# Patient Record
Sex: Female | Born: 1950 | Race: White | Hispanic: No | Marital: Married | Smoking: Never smoker
Health system: Southern US, Community
[De-identification: ages and names within clinical notes are randomized; demographics above are authoritative.]

## PROBLEM LIST (undated history)

## (undated) DIAGNOSIS — E039 Hypothyroidism, unspecified: Secondary | ICD-10-CM

## (undated) DIAGNOSIS — R1310 Dysphagia, unspecified: Secondary | ICD-10-CM

## (undated) DIAGNOSIS — R490 Dysphonia: Secondary | ICD-10-CM

## (undated) DIAGNOSIS — M1990 Unspecified osteoarthritis, unspecified site: Secondary | ICD-10-CM

## (undated) DIAGNOSIS — Z8719 Personal history of other diseases of the digestive system: Secondary | ICD-10-CM

## (undated) DIAGNOSIS — Z87442 Personal history of urinary calculi: Secondary | ICD-10-CM

## (undated) DIAGNOSIS — G709 Myoneural disorder, unspecified: Secondary | ICD-10-CM

## (undated) DIAGNOSIS — I499 Cardiac arrhythmia, unspecified: Secondary | ICD-10-CM

## (undated) DIAGNOSIS — B351 Tinea unguium: Secondary | ICD-10-CM

## (undated) DIAGNOSIS — M797 Fibromyalgia: Secondary | ICD-10-CM

## (undated) DIAGNOSIS — R51 Headache: Secondary | ICD-10-CM

## (undated) DIAGNOSIS — K219 Gastro-esophageal reflux disease without esophagitis: Secondary | ICD-10-CM

## (undated) DIAGNOSIS — M479 Spondylosis, unspecified: Secondary | ICD-10-CM

## (undated) DIAGNOSIS — I10 Essential (primary) hypertension: Secondary | ICD-10-CM

## (undated) DIAGNOSIS — R002 Palpitations: Secondary | ICD-10-CM

## (undated) HISTORY — PX: DILATION AND CURETTAGE OF UTERUS: SHX78

## (undated) HISTORY — PX: CHOLECYSTECTOMY: SHX55

## (undated) HISTORY — DX: Myoneural disorder, unspecified: G70.9

## (undated) HISTORY — DX: Tinea unguium: B35.1

## (undated) HISTORY — DX: Dysphagia, unspecified: R13.10

## (undated) HISTORY — PX: WISDOM TOOTH EXTRACTION: SHX21

## (undated) HISTORY — DX: Palpitations: R00.2

---

## 1977-06-28 HISTORY — PX: OTHER SURGICAL HISTORY: SHX169

## 1988-06-28 HISTORY — PX: ABDOMINAL HYSTERECTOMY: SHX81

## 1993-06-28 HISTORY — PX: BRAIN SURGERY: SHX531

## 2006-06-08 HISTORY — PX: COLONOSCOPY: SHX174

## 2008-08-23 ENCOUNTER — Ambulatory Visit (HOSPITAL_COMMUNITY): Admission: RE | Admit: 2008-08-23 | Discharge: 2008-08-23 | Payer: Self-pay | Admitting: Family Medicine

## 2009-06-28 HISTORY — PX: PARATHYROID EXPLORATION: SHX732

## 2010-01-29 ENCOUNTER — Encounter (HOSPITAL_COMMUNITY): Admission: RE | Admit: 2010-01-29 | Discharge: 2010-03-19 | Payer: Self-pay | Admitting: Surgery

## 2010-01-30 ENCOUNTER — Ambulatory Visit (HOSPITAL_COMMUNITY): Admission: RE | Admit: 2010-01-30 | Discharge: 2010-01-30 | Payer: Self-pay | Admitting: Family Medicine

## 2010-02-02 ENCOUNTER — Ambulatory Visit (HOSPITAL_COMMUNITY): Admission: RE | Admit: 2010-02-02 | Discharge: 2010-02-02 | Payer: Self-pay | Admitting: Family Medicine

## 2010-03-12 ENCOUNTER — Ambulatory Visit (HOSPITAL_COMMUNITY): Admission: RE | Admit: 2010-03-12 | Discharge: 2010-03-12 | Payer: Self-pay | Admitting: Surgery

## 2010-03-27 ENCOUNTER — Ambulatory Visit (HOSPITAL_COMMUNITY)
Admission: RE | Admit: 2010-03-27 | Discharge: 2010-03-27 | Payer: Self-pay | Admitting: Physical Medicine and Rehabilitation

## 2010-05-11 ENCOUNTER — Encounter (INDEPENDENT_AMBULATORY_CARE_PROVIDER_SITE_OTHER): Payer: Self-pay | Admitting: Surgery

## 2010-05-11 ENCOUNTER — Observation Stay (HOSPITAL_COMMUNITY): Admission: RE | Admit: 2010-05-11 | Discharge: 2010-05-12 | Payer: Self-pay | Admitting: Surgery

## 2010-09-08 LAB — URINALYSIS, ROUTINE W REFLEX MICROSCOPIC
Bilirubin Urine: NEGATIVE
Glucose, UA: NEGATIVE mg/dL
Hgb urine dipstick: NEGATIVE
Specific Gravity, Urine: 1.021 (ref 1.005–1.030)
Urobilinogen, UA: 0.2 mg/dL (ref 0.0–1.0)
pH: 6.5 (ref 5.0–8.0)

## 2010-09-08 LAB — DIFFERENTIAL
Basophils Relative: 1 % (ref 0–1)
Eosinophils Absolute: 0.3 10*3/uL (ref 0.0–0.7)
Lymphs Abs: 4.7 10*3/uL — ABNORMAL HIGH (ref 0.7–4.0)
Monocytes Absolute: 0.6 10*3/uL (ref 0.1–1.0)
Monocytes Relative: 6 % (ref 3–12)

## 2010-09-08 LAB — BASIC METABOLIC PANEL
CO2: 29 mEq/L (ref 19–32)
Chloride: 104 mEq/L (ref 96–112)
Glucose, Bld: 95 mg/dL (ref 70–99)
Potassium: 3.9 mEq/L (ref 3.5–5.1)
Sodium: 140 mEq/L (ref 135–145)

## 2010-09-08 LAB — CBC
HCT: 42.5 % (ref 36.0–46.0)
Hemoglobin: 14.5 g/dL (ref 12.0–15.0)
MCH: 28.5 pg (ref 26.0–34.0)
MCHC: 34.1 g/dL (ref 30.0–36.0)
MCV: 83.7 fL (ref 78.0–100.0)

## 2010-09-08 LAB — SURGICAL PCR SCREEN: Staphylococcus aureus: NEGATIVE

## 2010-09-10 LAB — CREATININE, SERUM
Creatinine, Ser: 0.93 mg/dL (ref 0.4–1.2)
GFR calc Af Amer: >60 mL/min (ref >60)

## 2010-09-10 LAB — BUN: BUN: 22 mg/dL (ref 6–23)

## 2011-09-07 ENCOUNTER — Other Ambulatory Visit (HOSPITAL_COMMUNITY): Payer: Self-pay | Admitting: Internal Medicine

## 2011-09-07 ENCOUNTER — Ambulatory Visit (HOSPITAL_COMMUNITY)
Admission: RE | Admit: 2011-09-07 | Discharge: 2011-09-07 | Disposition: A | Discharge status: Home / Self Care | Payer: Medicare HMO | Source: Ambulatory Visit | Attending: Internal Medicine | Admitting: Internal Medicine

## 2011-09-07 DIAGNOSIS — Z139 Encounter for screening, unspecified: Secondary | ICD-10-CM

## 2011-09-07 DIAGNOSIS — E039 Hypothyroidism, unspecified: Secondary | ICD-10-CM

## 2011-09-07 DIAGNOSIS — M869 Osteomyelitis, unspecified: Secondary | ICD-10-CM

## 2011-09-07 DIAGNOSIS — Z9889 Other specified postprocedural states: Secondary | ICD-10-CM | POA: Insufficient documentation

## 2011-09-07 DIAGNOSIS — Z01419 Encounter for gynecological examination (general) (routine) without abnormal findings: Secondary | ICD-10-CM

## 2011-09-14 ENCOUNTER — Ambulatory Visit (HOSPITAL_COMMUNITY)
Admission: RE | Admit: 2011-09-14 | Discharge: 2011-09-14 | Disposition: A | Discharge status: Home / Self Care | Payer: Medicare HMO | Source: Ambulatory Visit | Attending: Internal Medicine | Admitting: Internal Medicine

## 2011-09-14 DIAGNOSIS — E079 Disorder of thyroid, unspecified: Secondary | ICD-10-CM | POA: Insufficient documentation

## 2011-09-14 DIAGNOSIS — Z01419 Encounter for gynecological examination (general) (routine) without abnormal findings: Secondary | ICD-10-CM

## 2011-09-14 DIAGNOSIS — Z139 Encounter for screening, unspecified: Secondary | ICD-10-CM

## 2011-09-14 DIAGNOSIS — E039 Hypothyroidism, unspecified: Secondary | ICD-10-CM

## 2011-09-14 DIAGNOSIS — Z1231 Encounter for screening mammogram for malignant neoplasm of breast: Secondary | ICD-10-CM | POA: Insufficient documentation

## 2011-09-17 ENCOUNTER — Other Ambulatory Visit (HOSPITAL_COMMUNITY): Payer: Self-pay | Admitting: Internal Medicine

## 2011-09-17 DIAGNOSIS — M869 Osteomyelitis, unspecified: Secondary | ICD-10-CM

## 2011-09-23 ENCOUNTER — Encounter (HOSPITAL_COMMUNITY)
Admission: RE | Admit: 2011-09-23 | Discharge: 2011-09-23 | Disposition: A | Discharge status: Home / Self Care | Payer: Medicare HMO | Source: Ambulatory Visit | Attending: Internal Medicine | Admitting: Internal Medicine

## 2011-09-23 ENCOUNTER — Encounter (HOSPITAL_COMMUNITY): Payer: Self-pay

## 2011-09-23 DIAGNOSIS — M869 Osteomyelitis, unspecified: Secondary | ICD-10-CM | POA: Insufficient documentation

## 2011-09-23 DIAGNOSIS — R51 Headache: Secondary | ICD-10-CM | POA: Insufficient documentation

## 2011-09-23 HISTORY — DX: Essential (primary) hypertension: I10

## 2011-09-23 MED ORDER — TECHNETIUM TC 99M MEDRONATE IV KIT
25.0000 | PACK | Freq: Once | INTRAVENOUS | Status: AC | PRN
  Administered 2011-09-23: 25 via INTRAVENOUS

## 2011-12-20 ENCOUNTER — Other Ambulatory Visit (HOSPITAL_COMMUNITY): Payer: Self-pay | Admitting: Family Medicine

## 2011-12-20 DIAGNOSIS — E039 Hypothyroidism, unspecified: Secondary | ICD-10-CM

## 2012-01-04 ENCOUNTER — Ambulatory Visit (HOSPITAL_COMMUNITY): Payer: Medicare HMO

## 2012-01-11 ENCOUNTER — Ambulatory Visit (HOSPITAL_COMMUNITY)
Admission: RE | Admit: 2012-01-11 | Discharge: 2012-01-11 | Disposition: A | Discharge status: Home / Self Care | Payer: Medicare HMO | Source: Ambulatory Visit | Attending: Family Medicine | Admitting: Family Medicine

## 2012-01-11 DIAGNOSIS — E049 Nontoxic goiter, unspecified: Secondary | ICD-10-CM | POA: Insufficient documentation

## 2012-01-11 DIAGNOSIS — E039 Hypothyroidism, unspecified: Secondary | ICD-10-CM | POA: Insufficient documentation

## 2013-04-03 ENCOUNTER — Other Ambulatory Visit (HOSPITAL_COMMUNITY): Payer: Self-pay | Admitting: Physician Assistant

## 2013-04-03 DIAGNOSIS — Z0000 Encounter for general adult medical examination without abnormal findings: Secondary | ICD-10-CM

## 2013-04-09 ENCOUNTER — Ambulatory Visit (HOSPITAL_COMMUNITY): Payer: Medicare HMO

## 2013-04-10 ENCOUNTER — Inpatient Hospital Stay (HOSPITAL_COMMUNITY): Admission: RE | Admit: 2013-04-10 | Payer: Medicare HMO | Source: Ambulatory Visit

## 2013-04-30 ENCOUNTER — Other Ambulatory Visit: Payer: Self-pay | Admitting: Gastroenterology

## 2013-04-30 DIAGNOSIS — K219 Gastro-esophageal reflux disease without esophagitis: Secondary | ICD-10-CM

## 2013-04-30 DIAGNOSIS — R1310 Dysphagia, unspecified: Secondary | ICD-10-CM

## 2013-05-01 ENCOUNTER — Encounter (HOSPITAL_COMMUNITY): Payer: Self-pay | Admitting: *Deleted

## 2013-05-02 ENCOUNTER — Ambulatory Visit
Admission: RE | Admit: 2013-05-02 | Discharge: 2013-05-02 | Disposition: A | Discharge status: Home / Self Care | Payer: Medicare Other | Source: Ambulatory Visit | Attending: Gastroenterology | Admitting: Gastroenterology

## 2013-05-02 DIAGNOSIS — K219 Gastro-esophageal reflux disease without esophagitis: Secondary | ICD-10-CM

## 2013-05-02 DIAGNOSIS — R1310 Dysphagia, unspecified: Secondary | ICD-10-CM

## 2013-05-04 ENCOUNTER — Encounter (HOSPITAL_COMMUNITY): Payer: Self-pay | Admitting: Pharmacy Technician

## 2013-05-08 ENCOUNTER — Other Ambulatory Visit: Payer: Self-pay | Admitting: Gastroenterology

## 2013-05-09 ENCOUNTER — Encounter (HOSPITAL_COMMUNITY): Payer: Self-pay | Admitting: *Deleted

## 2013-05-09 ENCOUNTER — Encounter (HOSPITAL_COMMUNITY)
Admission: RE | Disposition: A | Discharge status: Home / Self Care | Payer: Self-pay | Source: Ambulatory Visit | Attending: Gastroenterology

## 2013-05-09 ENCOUNTER — Ambulatory Visit (HOSPITAL_COMMUNITY)
Admission: RE | Admit: 2013-05-09 | Discharge: 2013-05-09 | Disposition: A | Discharge status: Home / Self Care | Payer: Medicare Other | Source: Ambulatory Visit | Attending: Gastroenterology | Admitting: Gastroenterology

## 2013-05-09 ENCOUNTER — Encounter (HOSPITAL_COMMUNITY): Payer: Medicare Other | Admitting: Anesthesiology

## 2013-05-09 ENCOUNTER — Ambulatory Visit (HOSPITAL_COMMUNITY): Payer: Medicare Other | Admitting: Anesthesiology

## 2013-05-09 DIAGNOSIS — K219 Gastro-esophageal reflux disease without esophagitis: Secondary | ICD-10-CM | POA: Insufficient documentation

## 2013-05-09 DIAGNOSIS — K0381 Cracked tooth: Secondary | ICD-10-CM | POA: Insufficient documentation

## 2013-05-09 DIAGNOSIS — K449 Diaphragmatic hernia without obstruction or gangrene: Secondary | ICD-10-CM | POA: Insufficient documentation

## 2013-05-09 DIAGNOSIS — I10 Essential (primary) hypertension: Secondary | ICD-10-CM | POA: Insufficient documentation

## 2013-05-09 DIAGNOSIS — R1310 Dysphagia, unspecified: Secondary | ICD-10-CM | POA: Insufficient documentation

## 2013-05-09 DIAGNOSIS — R490 Dysphonia: Secondary | ICD-10-CM | POA: Insufficient documentation

## 2013-05-09 DIAGNOSIS — K2960 Other gastritis without bleeding: Secondary | ICD-10-CM | POA: Insufficient documentation

## 2013-05-09 DIAGNOSIS — E039 Hypothyroidism, unspecified: Secondary | ICD-10-CM | POA: Insufficient documentation

## 2013-05-09 HISTORY — DX: Gastro-esophageal reflux disease without esophagitis: K21.9

## 2013-05-09 HISTORY — PX: ESOPHAGOGASTRODUODENOSCOPY (EGD) WITH PROPOFOL: SHX5813

## 2013-05-09 HISTORY — DX: Hypothyroidism, unspecified: E03.9

## 2013-05-09 HISTORY — PX: BRAVO PH STUDY: SHX5421

## 2013-05-09 HISTORY — DX: Unspecified osteoarthritis, unspecified site: M19.90

## 2013-05-09 HISTORY — DX: Personal history of urinary calculi: Z87.442

## 2013-05-09 HISTORY — DX: Headache: R51

## 2013-05-09 SURGERY — ESOPHAGOGASTRODUODENOSCOPY (EGD) WITH PROPOFOL
Anesthesia: Monitor Anesthesia Care

## 2013-05-09 MED ORDER — KETAMINE HCL 50 MG/ML IJ SOLN
INTRAMUSCULAR | Status: DC | PRN
  Administered 2013-05-09: 25 mg via INTRAMUSCULAR

## 2013-05-09 MED ORDER — LACTATED RINGERS IV SOLN
INTRAVENOUS | Status: DC | PRN
  Administered 2013-05-09: 09:00:00 via INTRAVENOUS

## 2013-05-09 MED ORDER — BUTAMBEN-TETRACAINE-BENZOCAINE 2-2-14 % EX AERO
INHALATION_SPRAY | CUTANEOUS | Status: DC | PRN
  Administered 2013-05-09: 2 via TOPICAL

## 2013-05-09 MED ORDER — PROPOFOL INFUSION 10 MG/ML OPTIME
INTRAVENOUS | Status: DC | PRN
  Administered 2013-05-09: 100 ug/kg/min via INTRAVENOUS

## 2013-05-09 SURGICAL SUPPLY — 14 items

## 2013-05-09 NOTE — Transfer of Care (Signed)
Immediate Anesthesia Transfer of Care Note  Patient: Kim Warren  Procedure(s) Performed: Procedure(s): ESOPHAGOGASTRODUODENOSCOPY (EGD) WITH PROPOFOL (N/A) BRAVO PH STUDY (N/A)  Patient Location: PACU  Anesthesia Type:MAC  Level of Consciousness: awake, alert  and oriented  Airway & Oxygen Therapy: Patient Spontanous Breathing and Patient connected to nasal cannula oxygen  Post-op Assessment: Report given to PACU RN, Post -op Vital signs reviewed and stable and Patient moving all extremities X 4  Post vital signs: Reviewed and stable  Complications: No apparent anesthesia complications

## 2013-05-09 NOTE — Op Note (Signed)
[facility] [address] [place], [address]   ENDOSCOPY PROCEDURE REPORT  PATIENT: Kim Warren, Kim Warren  MR#: [id number] BIRTHDATE: Mar 19, 1951 , 62  yrs. old GENDER: Female ENDOSCOPIST: Willis Modena, MD REFERRED BY:  Assunta Found, M.D. PROCEDURE DATE:  05/09/2013 PROCEDURE:  EGD w/ Bravo capsule placement (ON PPI) ASA CLASS:     Class II INDICATIONS:  Hoarseness, dysphagia, GERD, refractory to medical therapy. MEDICATIONS: MAC sedation, administered by CRNA TOPICAL ANESTHETIC: Cetacaine Spray  DESCRIPTION OF PROCEDURE: After the risks benefits and alternatives of the procedure were thoroughly explained, informed consent was obtained.  The Pentax Gastroscope [id number] endoscope was introduced through the mouth and advanced to the second portion of the duodenum. Without limitations.  The instrument was slowly withdrawn as the mucosa was fully examined.     Findings:  Small hiatal hernia, otherwise normal esophagus.  Mild gastritis with a few antral erosions, otherwise normal stomach and pylorus.  Normal duodenum to the second portion.    Upon completion of our diagnostic exam, a Bravo capsule was placed 6cm proximal to the GE junction employing standard protocol.  Appropriate positioning of the capsule was confirmed endoscopically to complete the procedure.          The scope was then withdrawn from the patient and the procedure completed.  ENDOSCOPIC IMPRESSION:     As above.  Successful Bravo capsule placement.  RECOMMENDATIONS:     1.  Watch for potential complications of procedure. 2.  Continue Nexium 40 mg bid. 3.  Await Bravo results. 4.  Follow-up with [facility] in 3-4 weeks.  eSigned:  Willis Modena, MD 05/09/2013 10:51 AM   CC:

## 2013-05-09 NOTE — H&P (Signed)
Patient interval history reviewed.  Patient examined again.  There has been no change from documented H/P dated 04/25/13 (scanned into chart from our office) except as documented above.  Assessment:  1.  Hoarseness, GERD symptoms, refractory to PPI therapy.  Plan:  1.  EGD with Bravo capsule placement. 2.  Risks (bleeding, infection, bowel perforation that could require surgery, sedation-related changes in cardiopulmonary systems), benefits (identification and possible treatment of source of symptoms, exclusion of certain causes of symptoms), and alternatives (watchful waiting, radiographic imaging studies, empiric medical treatment) of upper endoscopy with Bravo capsule placement (EGD with Bravo) were explained to patient/family in detail and patient wishes to proceed.

## 2013-05-09 NOTE — Anesthesia Preprocedure Evaluation (Addendum)
Anesthesia Evaluation  Patient identified by MRN, date of birth, ID band Patient awake    Reviewed: Allergy & Precautions, H&P , NPO status , Patient's Chart, lab work & pertinent test results  Airway Mallampati: II TM Distance: >3 FB Neck ROM: Full    Dental no notable dental hx. (+) Poor Dentition   Pulmonary neg pulmonary ROS,  breath sounds clear to auscultation  Pulmonary exam normal       Cardiovascular hypertension, Pt. on medications Rhythm:Regular Rate:Normal     Neuro/Psych negative neurological ROS  negative psych ROS   GI/Hepatic negative GI ROS, Neg liver ROS,   Endo/Other  Hypothyroidism   Renal/GU negative Renal ROS  negative genitourinary   Musculoskeletal negative musculoskeletal ROS (+)   Abdominal   Peds negative pediatric ROS (+)  Hematology negative hematology ROS (+)   Anesthesia Other Findings Upper front cap and back broken tooth  Reproductive/Obstetrics negative OB ROS                           Anesthesia Physical Anesthesia Plan  ASA: II  Anesthesia Plan: MAC   Post-op Pain Management:    Induction:   Airway Management Planned:   Additional Equipment:   Intra-op Plan:   Post-operative Plan:   Informed Consent: I have reviewed the patients History and Physical, chart, labs and discussed the procedure including the risks, benefits and alternatives for the proposed anesthesia with the patient or authorized representative who has indicated his/her understanding and acceptance.   Dental advisory given  Plan Discussed with: CRNA  Anesthesia Plan Comments:         Anesthesia Quick Evaluation

## 2013-05-09 NOTE — Addendum Note (Signed)
Addended by: Willis Modena on: 05/09/2013 07:45 AM   Modules accepted: Orders

## 2013-05-09 NOTE — Anesthesia Postprocedure Evaluation (Signed)
  Anesthesia Post-op Note  Patient: Kim Warren  Procedure(s) Performed: Procedure(s) (LRB): ESOPHAGOGASTRODUODENOSCOPY (EGD) WITH PROPOFOL (N/A) BRAVO PH STUDY (N/A)  Patient Location: PACU  Anesthesia Type: MAC  Level of Consciousness: awake and alert   Airway and Oxygen Therapy: Patient Spontanous Breathing  Post-op Pain: mild  Post-op Assessment: Post-op Vital signs reviewed, Patient's Cardiovascular Status Stable, Respiratory Function Stable, Patent Airway and No signs of Nausea or vomiting  Last Vitals:  Filed Vitals:   05/09/13 1120  BP: 127/72  Pulse:   Temp:   Resp: 14    Post-op Vital Signs: stable   Complications: No apparent anesthesia complications

## 2013-05-09 NOTE — Preoperative (Signed)
Beta Blockers   Reason not to administer Beta Blockers:Not Applicable 

## 2013-05-10 ENCOUNTER — Encounter (HOSPITAL_COMMUNITY): Payer: Self-pay | Admitting: Gastroenterology

## 2013-06-14 ENCOUNTER — Encounter (INDEPENDENT_AMBULATORY_CARE_PROVIDER_SITE_OTHER): Payer: Self-pay | Admitting: General Surgery

## 2013-06-27 ENCOUNTER — Encounter (INDEPENDENT_AMBULATORY_CARE_PROVIDER_SITE_OTHER): Payer: Self-pay | Admitting: Surgery

## 2013-06-27 ENCOUNTER — Ambulatory Visit
Admission: RE | Admit: 2013-06-27 | Discharge: 2013-06-27 | Disposition: A | Discharge status: Home / Self Care | Payer: Medicare Other | Source: Ambulatory Visit | Attending: Surgery | Admitting: Surgery

## 2013-06-27 ENCOUNTER — Ambulatory Visit (INDEPENDENT_AMBULATORY_CARE_PROVIDER_SITE_OTHER): Payer: Medicare Other | Admitting: Surgery

## 2013-06-27 VITALS — BP 126/82 | HR 78 | Temp 97.6°F | Resp 16 | Ht 60.0 in | Wt 170.2 lb

## 2013-06-27 DIAGNOSIS — K219 Gastro-esophageal reflux disease without esophagitis: Secondary | ICD-10-CM

## 2013-06-27 DIAGNOSIS — Z862 Personal history of diseases of the blood and blood-forming organs and certain disorders involving the immune mechanism: Secondary | ICD-10-CM

## 2013-06-27 DIAGNOSIS — Z8639 Personal history of other endocrine, nutritional and metabolic disease: Secondary | ICD-10-CM

## 2013-06-27 NOTE — Progress Notes (Signed)
Chief Complaint:  GERD-severe for 4 years  History of Present Illness:  Kim Warren is an 62 y.o. female comes today with her daughter to discuss her GERD that has been severe affecting her voice and vocal cords. This is been worse over the last 4 years and interestingly over that period of time she has lost about 4 inches in height. 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  Her calciums have returned to normal. She has had a workup by Dr. Chrissie Noa outlaw and I did review an esophagram which does not really show a significant hiatal hernia. She's b studies showing that she has reflux.  She also showed me a palpable mass involving the head of her right clavicle. We'll get an x-ray on that.  I discussed Nissen fundoplication with her and her daughter in some detail including its risks and limitations. I think that her esophageal dysmotility is  related to her osteoporosis and loss of height.    Past Medical History  Diagnosis Date  . Hypertension   . Hypothyroidism   . GERD (gastroesophageal reflux disease)   . History of kidney stones   . Arthritis   . Headache(784.0)     sinus headaches-seasonal  . Onychomycosis   . Neuromuscular disorder     fibromyalgia  . Dysphagia   . Palpitations     Past Surgical History  Procedure Laterality Date  . Cholecystectomy    . Periosteal chondroma  1979    left middle finger-benign  . Abdominal hysterectomy  1990    partial  . Dilation and curettage of uterus    . Brain surgery  1995    meningioma-benign  . Parathyroid exploration  2011    tumor off-benign  . Esophagogastroduodenoscopy (egd) with propofol N/A 05/09/2013    Procedure: ESOPHAGOGASTRODUODENOSCOPY (EGD) WITH PROPOFOL;  Surgeon: Willis Modena, MD;  Location: [facility];  Service: Endoscopy;  Laterality: N/A;  . Bravo ph study N/A 05/09/2013    Procedure: BRAVO PH STUDY;  Surgeon: Willis Modena, MD;  Location: [facility];  Service: Endoscopy;  Laterality:  N/A;  . Colonoscopy  06/08/06  . Wisdom tooth extraction      Current Outpatient Prescriptions  Medication Sig Dispense Refill  . aspirin EC 81 MG tablet Take 81 mg by mouth daily.      . Biotin 5000 MCG TABS Take 1 tablet by mouth daily.      Marland Kitchen bismuth subsalicylate (PEPTO BISMOL) 262 MG/15ML suspension Take 30 mLs by mouth every 6 (six) hours as needed for indigestion.      . Coenzyme Q10 (COQ10) 100 MG CAPS Take by mouth.      . cyclobenzaprine (FLEXERIL) 10 MG tablet Take 10 mg by mouth 3 (three) times daily.      . diphenoxylate-atropine (LOMOTIL) 2.5-0.025 MG per tablet Take 1 tablet by mouth 4 (four) times daily as needed for diarrhea or loose stools.      Marland Kitchen esomeprazole (NEXIUM) 40 MG capsule Take 40 mg by mouth 2 (two) times daily.      . fenofibrate 160 MG tablet Take 160 mg by mouth at bedtime.      . hydrochlorothiazide (HYDRODIURIL) 25 MG tablet Take 25 mg by mouth every morning.      Marland Kitchen HYDROcodone-acetaminophen (NORCO) 10-325 MG per tablet Take 1 tablet by mouth 3 (three) times daily.      Marland Kitchen lisinopril (PRINIVIL,ZESTRIL) 10 MG tablet Take 10 mg by mouth every morning.      Marland Kitchen  loratadine (CLARITIN) 10 MG tablet Take 10 mg by mouth every morning.      . meloxicam (MOBIC) 7.5 MG tablet Take 7.5 mg by mouth every morning.      . methylPREDNIsolone (MEDROL DOSPACK) 4 MG tablet       . nebivolol (BYSTOLIC) 5 MG tablet Take 5 mg by mouth every morning.      . pseudoephedrine (SUDAFED) 30 MG tablet Take 30 mg by mouth every 4 (four) hours as needed for congestion.      Marland Kitchen zolpidem (AMBIEN) 10 MG tablet Take 10 mg by mouth at bedtime as needed for sleep.      Marland Kitchen levothyroxine (SYNTHROID, LEVOTHROID) 100 MCG tablet Take 100 mcg by mouth daily before breakfast.      . Omega-3 Fatty Acids (FISH OIL) 1200 MG CAPS Take 1 capsule by mouth daily.       No current facility-administered medications for this visit.   Erythromycin and Morphine and related Family History  Problem Relation Age of  Onset  . Cancer Mother     colon cancer?  . Heart disease Father    Social History:   reports that she has never smoked. She does not have any smokeless tobacco history on file. She reports that she does not drink alcohol or use illicit drugs.   REVIEW OF SYSTEMS - PERTINENT POSITIVES ONLY: Positive see old chart.  Physical Exam:   Blood pressure 126/82, pulse 78, temperature 97.6 F (36.4 C), temperature source Temporal, resp. rate 16, height 5' (1.524 m), weight 170 lb 3.2 oz (77.202 kg). Body mass index is 33.24 kg/(m^2).  Gen:  WDWN white female NAD  Neurological: Alert and oriented to person, place, and time. Motor and sensory function is grossly intact  Head: Normocephalic and atraumatic.  Eyes: Conjunctivae are normal. Pupils are equal, round, and reactive to light. No scleral icterus.  Neck: Normal range of motion. Neck supple. No tracheal deviation or thyromegaly present. Prior parathyroid surgery scar is healed.  The right clavicular head is enlarged slighly.   LABORATORY RESULTS: No results found for this or any previous visit (from the past 48 hour(s)).  RADIOLOGY RESULTS: No results found.  Problem List: There are no active problems to display for this patient.   Assessment & Plan: GERD and esophageal dysmotility.  I think she is probably a good candidate for a laparoscopic Nissen fundoplication. We will also assess his clavicle with an x-ray to make sure she did have some bony cyst possibly a brown cyst related to her prior parathyroid adenoma. We'll see her back in 3 weeks at which time she can discuss whether she was to move forward with scheduling surgery.    Matt B. Daphine Deutscher, MD, [facility] [phone] beeper [phone]  06/27/2013 12:28 PM

## 2013-06-27 NOTE — Patient Instructions (Signed)
Nissen Fundoplication Care After Please read the instructions outlined below and refer to this sheet for the next few weeks. These discharge instructions provide you with general information on caring for yourself after you leave the hospital. Your doctor may also give you specific instructions. While your treatment has been planned according to the most current medical practices available, unavoidable complications sometimes happen. If you have any problems or questions after discharge, please call your doctor. ACTIVITY  Take frequent rest periods throughout the day.  Take frequent walks throughout the day. This will help to prevent blood clots.  Continue to do your coughing and deep breathing exercises once you get home. This will help to prevent pneumonia.  No strenuous activities such as heavy lifting, pushing or pulling until after your follow-up visit with your doctor. Do not lift anything heavier than 10 pounds.  Talk with your caregiver about when you may return to work and your exercise routine.  You may shower 2 days after surgery. Pat incisions dry. Do not rub incisions with washcloth or towel.  Do not drive while taking prescription pain medication. NUTRITION  Continue with a liquid diet, or the diet you were directed to take, until your first follow-up visit with your surgeon.  Drink fluids (6-8 glasses a day).  Call your caregiver for persistent nausea (feeling sick to your stomach), vomiting, bloating or difficulty swallowing. ELIMINATION It is very important not to strain during bowel movements. If constipation should occur, you may:  Take a mild laxative (such as Milk of Magnesia).  Add fruit and bran to your diet.  Drink more fluids.  Call your caregiver if constipation is not relieved. FEVER If you feel feverish or have shaking chills, take your temperature. If it is 102 F (38.9 C) or above, call your caregiver. The fever may mean there is an infection. PAIN  CONTROL  If a prescription was given for a pain reliever, please follow your caregiver's directions.  Only take over-the-counter or prescription medicines for pain, discomfort, or fever as directed by your caregiver.  If the pain is not relieved by your medicine, becomes worse, or you have difficulty breathing, call your doctor. INCISION  It is normal for your cuts (incisions) from surgery to have a small amount of drainage for the first 1-2 days. Once the drainage has stopped, leave your incision(s) open to air.  Check your incision(s) and surrounding area daily for any redness, swelling, increased drainage or bleeding. If any of these are present or if the wound edges start to separate, call your doctor.  If you have small adhesive strips in place, they will peel and fall off. (If these strips are covered with a clear bandage, your doctor will tell you when to remove them.)  If you have staples, your caregiver will remove them at the follow-up appointment. Document Released: 02/05/2004 Document Revised: 09/06/2011 Document Reviewed: 05/11/2007 Exit[employer] Patient Information 2014 [employer].  

## 2013-07-16 ENCOUNTER — Other Ambulatory Visit (HOSPITAL_COMMUNITY): Payer: Self-pay | Admitting: Internal Medicine

## 2013-07-16 DIAGNOSIS — R222 Localized swelling, mass and lump, trunk: Secondary | ICD-10-CM

## 2013-07-18 ENCOUNTER — Ambulatory Visit (HOSPITAL_COMMUNITY)
Admission: RE | Admit: 2013-07-18 | Discharge: 2013-07-18 | Disposition: A | Discharge status: Home / Self Care | Payer: Medicare HMO | Source: Ambulatory Visit | Attending: Internal Medicine | Admitting: Internal Medicine

## 2013-07-18 ENCOUNTER — Encounter (INDEPENDENT_AMBULATORY_CARE_PROVIDER_SITE_OTHER): Payer: Medicare Other | Admitting: Surgery

## 2013-07-18 DIAGNOSIS — R222 Localized swelling, mass and lump, trunk: Secondary | ICD-10-CM

## 2013-07-18 DIAGNOSIS — M47814 Spondylosis without myelopathy or radiculopathy, thoracic region: Secondary | ICD-10-CM | POA: Insufficient documentation

## 2013-07-18 DIAGNOSIS — R229 Localized swelling, mass and lump, unspecified: Secondary | ICD-10-CM | POA: Insufficient documentation

## 2013-08-31 ENCOUNTER — Other Ambulatory Visit (INDEPENDENT_AMBULATORY_CARE_PROVIDER_SITE_OTHER): Payer: Self-pay | Admitting: Surgery

## 2013-08-31 ENCOUNTER — Ambulatory Visit (INDEPENDENT_AMBULATORY_CARE_PROVIDER_SITE_OTHER): Payer: Medicare HMO | Admitting: Surgery

## 2013-08-31 ENCOUNTER — Encounter (INDEPENDENT_AMBULATORY_CARE_PROVIDER_SITE_OTHER): Payer: Self-pay | Admitting: Surgery

## 2013-08-31 VITALS — BP 134/86 | HR 78 | Temp 98.8°F | Resp 16 | Ht 60.0 in | Wt 172.2 lb

## 2013-08-31 DIAGNOSIS — K219 Gastro-esophageal reflux disease without esophagitis: Secondary | ICD-10-CM

## 2013-08-31 NOTE — Progress Notes (Signed)
Chief Complaint:  GERD, osteoporosis, hoarseness, severe reflux  History of Present Illness:  Kim Warren is an 63 y.o. female seen by me in December with a very significant symptoms of GERD including nocturnal reflux hoarseness and ear pain. We have discussed lap Nissen before and she decided she wants to pursue this. I described the procedure to her in some detail and she elected go ahead and get this scheduled.  Past Medical History  Diagnosis Date  . Hypertension   . Hypothyroidism   . GERD (gastroesophageal reflux disease)   . History of kidney stones   . Arthritis   . Headache(784.0)     sinus headaches-seasonal  . Onychomycosis   . Neuromuscular disorder     fibromyalgia  . Dysphagia   . Palpitations     Past Surgical History  Procedure Laterality Date  . Cholecystectomy    . Periosteal chondroma  1979    left middle finger-benign  . Abdominal hysterectomy  1990    partial  . Dilation and curettage of uterus    . Brain surgery  1995    meningioma-benign  . Parathyroid exploration  2011    tumor off-benign  . Esophagogastroduodenoscopy (egd) with propofol N/A 05/09/2013    Procedure: ESOPHAGOGASTRODUODENOSCOPY (EGD) WITH PROPOFOL;  Surgeon: Arta Silence, MD;  Location: [facility];  Service: Endoscopy;  Laterality: N/A;  . Bravo ph study N/A 05/09/2013    Procedure: BRAVO [place];  Surgeon: Arta Silence, MD;  Location: [facility];  Service: Endoscopy;  Laterality: N/A;  . Colonoscopy  06/08/06  . Wisdom tooth extraction      Current Outpatient Prescriptions  Medication Sig Dispense Refill  . aspirin EC 81 MG tablet Take 81 mg by mouth daily.      . Biotin 5000 MCG TABS Take 1 tablet by mouth daily.      Marland Kitchen bismuth subsalicylate (PEPTO BISMOL) 262 MG/15ML suspension Take 30 mLs by mouth every 6 (six) hours as needed for indigestion.      . Coenzyme Q10 (COQ10) 100 MG CAPS Take by mouth.      . cyclobenzaprine (FLEXERIL) 10 MG tablet Take 10 mg by mouth 3  (three) times daily.      . diphenoxylate-atropine (LOMOTIL) 2.5-0.025 MG per tablet Take 1 tablet by mouth 4 (four) times daily as needed for diarrhea or loose stools.      Marland Kitchen esomeprazole (NEXIUM) 40 MG capsule Take 40 mg by mouth 2 (two) times daily.      . fenofibrate 160 MG tablet Take 160 mg by mouth at bedtime.      . hydrochlorothiazide (HYDRODIURIL) 25 MG tablet Take 25 mg by mouth every morning.      Marland Kitchen HYDROcodone-acetaminophen (NORCO) 10-325 MG per tablet Take 1 tablet by mouth 3 (three) times daily.      Marland Kitchen levothyroxine (SYNTHROID, LEVOTHROID) 100 MCG tablet Take 100 mcg by mouth daily before breakfast.      . lisinopril (PRINIVIL,ZESTRIL) 10 MG tablet Take 10 mg by mouth every morning.      . loratadine (CLARITIN) 10 MG tablet Take 10 mg by mouth every morning.      . meloxicam (MOBIC) 7.5 MG tablet Take 7.5 mg by mouth every morning.      . methylPREDNIsolone (MEDROL DOSPACK) 4 MG tablet       . metoprolol succinate (TOPROL-XL) 50 MG 24 hr tablet Take 50 mg by mouth daily. Take with or immediately following a meal.      .  Omega-3 Fatty Acids (FISH OIL) 1200 MG CAPS Take 1 capsule by mouth daily.      . pseudoephedrine (SUDAFED) 30 MG tablet Take 30 mg by mouth every 4 (four) hours as needed for congestion.      Marland Kitchen zolpidem (AMBIEN) 10 MG tablet Take 10 mg by mouth at bedtime as needed for sleep.       No current facility-administered medications for this visit.   Erythromycin and Morphine and related Family History  Problem Relation Age of Onset  . Cancer Mother     colon cancer?  . Heart disease Father    Social History:   reports that she has never smoked. She does not have any smokeless tobacco history on file. She reports that she does not drink alcohol or use illicit drugs.   REVIEW OF SYSTEMS - PERTINENT POSITIVES ONLY: No history of DVT  Physical Exam:   Blood pressure 134/86, pulse 78, temperature 98.8 F (37.1 C), temperature source Oral, resp. rate 16, height 5'  (1.524 m), weight 172 lb 3.2 oz (78.109 kg). Body mass index is 33.63 kg/(m^2).  Gen:  WDWN white female NAD  Neurological: Alert and oriented to person, place, and time. Motor and sensory function is grossly intact  Head: Normocephalic and atraumatic.  Eyes: Conjunctivae are normal. Pupils are equal, round, and reactive to light. No scleral icterus.  Neck: Normal range of motion. Neck supple. No tracheal deviation or thyromegaly present.  Cardiovascular:  SR without murmurs or gallops.  No carotid bruits Respiratory: Effort normal.  No respiratory distress. No chest wall tenderness. Breath sounds normal.  No wheezes, rales or rhonchi.  Abdomen:  nontender GU: Musculoskeletal: Normal range of motion. Extremities are nontender. No cyanosis, edema or clubbing noted Lymphadenopathy: No cervical, preauricular, postauricular or axillary adenopathy is present Skin: Skin is warm and dry. No rash noted. No diaphoresis. No erythema. No pallor. Pscyh: Normal mood and affect. Behavior is normal. Judgment and thought content normal.   LABORATORY RESULTS: No results found for this or any previous visit (from the past 48 hour(s)).  RADIOLOGY RESULTS: No results found.  Problem List: Patient Active Problem List   Diagnosis Date Noted  . GERD (gastroesophageal reflux disease) 06/27/2013    Assessment & Plan: Symptomatic gastroesophageal reflux disease severe plan laparoscopic Nissen fundoplication and hiatal hernia repair.    Matt B. Hassell Done, MD, [facility] [phone] beeper [phone]  08/31/2013 3:45 PM

## 2013-08-31 NOTE — Patient Instructions (Signed)
Nissen Fundoplication Care After Please read the instructions outlined below and refer to this sheet for the next few weeks. These discharge instructions provide you with general information on caring for yourself after you leave the hospital. Your doctor may also give you specific instructions. While your treatment has been planned according to the most current medical practices available, unavoidable complications sometimes happen. If you have any problems or questions after discharge, please call your doctor. ACTIVITY  Take frequent rest periods throughout the day.  Take frequent walks throughout the day. This will help to prevent blood clots.  Continue to do your coughing and deep breathing exercises once you get home. This will help to prevent pneumonia.  No strenuous activities such as heavy lifting, pushing or pulling until after your follow-up visit with your doctor. Do not lift anything heavier than 10 pounds.  Talk with your caregiver about when you may return to work and your exercise routine.  You may shower 2 days after surgery. Pat incisions dry. Do not rub incisions with washcloth or towel.  Do not drive while taking prescription pain medication. NUTRITION  Continue with a liquid diet, or the diet you were directed to take, until your first follow-up visit with your surgeon.  Drink fluids (6-8 glasses a day).  Call your caregiver for persistent nausea (feeling sick to your stomach), vomiting, bloating or difficulty swallowing. ELIMINATION It is very important not to strain during bowel movements. If constipation should occur, you may:  Take a mild laxative (such as Milk of Magnesia).  Add fruit and bran to your diet.  Drink more fluids.  Call your caregiver if constipation is not relieved. FEVER If you feel feverish or have shaking chills, take your temperature. If it is 102 F (38.9 C) or above, call your caregiver. The fever may mean there is an infection. PAIN  CONTROL  If a prescription was given for a pain reliever, please follow your caregiver's directions.  Only take over-the-counter or prescription medicines for pain, discomfort, or fever as directed by your caregiver.  If the pain is not relieved by your medicine, becomes worse, or you have difficulty breathing, call your doctor. INCISION  It is normal for your cuts (incisions) from surgery to have a small amount of drainage for the first 1-2 days. Once the drainage has stopped, leave your incision(s) open to air.  Check your incision(s) and surrounding area daily for any redness, swelling, increased drainage or bleeding. If any of these are present or if the wound edges start to separate, call your doctor.  If you have small adhesive strips in place, they will peel and fall off. (If these strips are covered with a clear bandage, your doctor will tell you when to remove them.)  If you have staples, your caregiver will remove them at the follow-up appointment. Document Released: 02/05/2004 Document Revised: 09/06/2011 Document Reviewed: 05/11/2007 Exit[employer] Patient Information 2014 [employer].  

## 2013-09-12 ENCOUNTER — Encounter (HOSPITAL_COMMUNITY): Payer: Self-pay | Admitting: Pharmacy Technician

## 2013-09-18 ENCOUNTER — Encounter (HOSPITAL_COMMUNITY)
Admission: RE | Admit: 2013-09-18 | Discharge: 2013-09-18 | Disposition: A | Discharge status: Home / Self Care | Payer: Medicare HMO | Source: Ambulatory Visit | Attending: Surgery | Admitting: Surgery

## 2013-09-18 ENCOUNTER — Encounter (HOSPITAL_COMMUNITY): Payer: Self-pay

## 2013-09-18 HISTORY — DX: Spondylosis, unspecified: M47.9

## 2013-09-18 HISTORY — DX: Fibromyalgia: M79.7

## 2013-09-18 HISTORY — DX: Personal history of other diseases of the digestive system: Z87.19

## 2013-09-18 HISTORY — DX: Cardiac arrhythmia, unspecified: I49.9

## 2013-09-18 HISTORY — DX: Dysphonia: R49.0

## 2013-09-18 LAB — BASIC METABOLIC PANEL
BUN: 21 mg/dL (ref 6–23)
CHLORIDE: 99 meq/L (ref 96–112)
CO2: 23 mEq/L (ref 19–32)
CREATININE: 0.95 mg/dL (ref 0.50–1.10)
Calcium: 10 mg/dL (ref 8.4–10.5)
GFR calc non Af Amer: 63 mL/min — ABNORMAL LOW (ref >90)
GFR, EST AFRICAN AMERICAN: 73 mL/min — AB (ref >90)
Glucose, Bld: 123 mg/dL — ABNORMAL HIGH (ref 70–99)
Potassium: 4.2 mEq/L (ref 3.7–5.3)
Sodium: 137 mEq/L (ref 137–147)

## 2013-09-18 LAB — CBC
HCT: 43.2 % (ref 36.0–46.0)
Hemoglobin: 14.5 g/dL (ref 12.0–15.0)
MCH: 26.8 pg (ref 26.0–34.0)
MCHC: 33.6 g/dL (ref 30.0–36.0)
MCV: 79.7 fL (ref 78.0–100.0)
Platelets: 326 10*3/uL (ref 150–400)
RBC: 5.42 MIL/uL — ABNORMAL HIGH (ref 3.87–5.11)
RDW: 14.5 % (ref 11.5–15.5)
WBC: 9.7 10*3/uL (ref 4.0–10.5)

## 2013-09-18 MED ORDER — CHLORHEXIDINE GLUCONATE 4 % EX LIQD: 1.0000 "application " | Freq: Once | CUTANEOUS | Status: DC

## 2013-09-18 NOTE — Pre-Procedure Instructions (Signed)
CT CHEST REPORT IN EPIC FROM 07-18-13. EKG WAS DONE TODAY PREOP - AT [facility].

## 2013-09-18 NOTE — Patient Instructions (Addendum)
   YOUR SURGERY IS SCHEDULED AT [facility]  ON:  Monday  3/30  REPORT TO  SHORT STAY CENTER AT:  5:30 AM      PHONE # FOR SHORT STAY IS [phone]  DO NOT EAT OR DRINK ANYTHING AFTER MIDNIGHT THE NIGHT BEFORE YOUR SURGERY.  YOU MAY BRUSH YOUR TEETH, RINSE OUT YOUR MOUTH--BUT NO WATER, NO FOOD, NO CHEWING GUM, NO MINTS, NO CANDIES, NO CHEWING TOBACCO.  PLEASE TAKE THE FOLLOWING MEDICATIONS THE AM OF YOUR SURGERY WITH A FEW SIPS OF WATER:  NEXIUM, HYDROCODONE / ACETAMINOPHEN, CLARITIN, LEVOTHYROXINE, METOPROLOL.   DO NOT BRING VALUABLES, MONEY, CREDIT CARDS.  DO NOT WEAR JEWELRY, MAKE-UP, NAIL POLISH AND NO METAL PINS OR CLIPS IN YOUR HAIR. CONTACT LENS, DENTURES / PARTIALS, GLASSES SHOULD NOT BE WORN TO SURGERY AND IN MOST CASES-HEARING AIDS WILL NEED TO BE REMOVED.  BRING YOUR GLASSES CASE, ANY EQUIPMENT NEEDED FOR YOUR CONTACT LENS. FOR PATIENTS ADMITTED TO THE HOSPITAL--CHECK OUT TIME THE DAY OF DISCHARGE IS 11:00 AM.  ALL INPATIENT ROOMS ARE PRIVATE - WITH BATHROOM, TELEPHONE, TELEVISION AND WIFI INTERNET.   FAILURE TO FOLLOW THESE INSTRUCTIONS MAY RESULT IN THE CANCELLATION OF YOUR SURGERY. PLEASE BE AWARE THAT YOU MAY NEED ADDITIONAL BLOOD DRAWN DAY OF YOUR SURGERY  PATIENT SIGNATURE_________________________________

## 2013-09-21 ENCOUNTER — Ambulatory Visit (HOSPITAL_COMMUNITY)
Admission: RE | Admit: 2013-09-21 | Discharge: 2013-09-21 | Disposition: A | Discharge status: Home / Self Care | Payer: Medicare HMO | Source: Ambulatory Visit | Attending: Family Medicine | Admitting: Family Medicine

## 2013-09-21 ENCOUNTER — Other Ambulatory Visit (HOSPITAL_COMMUNITY): Payer: Self-pay | Admitting: Family Medicine

## 2013-09-21 ENCOUNTER — Encounter (HOSPITAL_COMMUNITY): Payer: Self-pay

## 2013-09-21 DIAGNOSIS — M5030 Other cervical disc degeneration, unspecified cervical region: Secondary | ICD-10-CM | POA: Insufficient documentation

## 2013-09-21 DIAGNOSIS — M4802 Spinal stenosis, cervical region: Secondary | ICD-10-CM | POA: Insufficient documentation

## 2013-09-21 DIAGNOSIS — M47812 Spondylosis without myelopathy or radiculopathy, cervical region: Secondary | ICD-10-CM | POA: Insufficient documentation

## 2013-09-21 DIAGNOSIS — M542 Cervicalgia: Secondary | ICD-10-CM

## 2013-09-21 DIAGNOSIS — M79609 Pain in unspecified limb: Secondary | ICD-10-CM | POA: Insufficient documentation

## 2013-09-23 ENCOUNTER — Other Ambulatory Visit (INDEPENDENT_AMBULATORY_CARE_PROVIDER_SITE_OTHER): Payer: Self-pay | Admitting: Surgery

## 2013-09-23 NOTE — Anesthesia Preprocedure Evaluation (Signed)
Anesthesia Evaluation  Patient identified by MRN, date of birth, ID band Patient awake    Reviewed: Allergy & Precautions, H&P , NPO status , Patient's Chart, lab work & pertinent test results, reviewed documented beta blocker date and time   Airway Mallampati: II TM Distance: >3 FB Neck ROM: Full    Dental no notable dental hx. (+) Poor Dentition   Pulmonary neg pulmonary ROS,  breath sounds clear to auscultation  Pulmonary exam normal       Cardiovascular hypertension, Pt. on medications and Pt. on home beta blockers Rhythm:Regular Rate:Normal     Neuro/Psych  Headaches, negative psych ROS   GI/Hepatic Neg liver ROS, hiatal hernia, GERD-  Medicated,  Endo/Other  Hypothyroidism   Renal/GU negative Renal ROS     Musculoskeletal  (+) Fibromyalgia -, narcotic dependent  Abdominal (+) + obese,   Peds  Hematology negative hematology ROS (+)   Anesthesia Other Findings Upper front cap and back broken tooth  Reproductive/Obstetrics negative OB ROS                           Anesthesia Physical  Anesthesia Plan  ASA: II  Anesthesia Plan: General   Post-op Pain Management:    Induction: Intravenous and Rapid sequence  Airway Management Planned: Oral ETT  Additional Equipment:   Intra-op Plan:   Post-operative Plan: Extubation in OR  Informed Consent: I have reviewed the patients History and Physical, chart, labs and discussed the procedure including the risks, benefits and alternatives for the proposed anesthesia with the patient or authorized representative who has indicated his/her understanding and acceptance.   Dental advisory given  Plan Discussed with: CRNA  Anesthesia Plan Comments:         Anesthesia Quick Evaluation

## 2013-09-24 ENCOUNTER — Encounter (HOSPITAL_COMMUNITY): Payer: Self-pay | Admitting: *Deleted

## 2013-09-24 ENCOUNTER — Encounter (HOSPITAL_COMMUNITY): Payer: Medicare HMO | Admitting: Anesthesiology

## 2013-09-24 ENCOUNTER — Inpatient Hospital Stay (HOSPITAL_COMMUNITY): Payer: Medicare HMO | Admitting: Anesthesiology

## 2013-09-24 ENCOUNTER — Inpatient Hospital Stay (HOSPITAL_COMMUNITY)
Admission: RE | Admit: 2013-09-24 | Discharge: 2013-09-26 | DRG: 328 | Disposition: A | Discharge status: Home / Self Care | Payer: Medicare HMO | Source: Ambulatory Visit | Attending: Surgery | Admitting: Surgery

## 2013-09-24 ENCOUNTER — Encounter (HOSPITAL_COMMUNITY)
Admission: RE | Disposition: A | Discharge status: Home / Self Care | Payer: Self-pay | Source: Ambulatory Visit | Attending: Surgery

## 2013-09-24 DIAGNOSIS — E039 Hypothyroidism, unspecified: Secondary | ICD-10-CM | POA: Diagnosis present

## 2013-09-24 DIAGNOSIS — K219 Gastro-esophageal reflux disease without esophagitis: Principal | ICD-10-CM | POA: Diagnosis present

## 2013-09-24 DIAGNOSIS — Z9889 Other specified postprocedural states: Secondary | ICD-10-CM | POA: Diagnosis present

## 2013-09-24 DIAGNOSIS — Z8249 Family history of ischemic heart disease and other diseases of the circulatory system: Secondary | ICD-10-CM

## 2013-09-24 DIAGNOSIS — K660 Peritoneal adhesions (postprocedural) (postinfection): Secondary | ICD-10-CM | POA: Diagnosis present

## 2013-09-24 DIAGNOSIS — Z87442 Personal history of urinary calculi: Secondary | ICD-10-CM

## 2013-09-24 DIAGNOSIS — K449 Diaphragmatic hernia without obstruction or gangrene: Secondary | ICD-10-CM | POA: Diagnosis present

## 2013-09-24 DIAGNOSIS — K2100 Gastro-esophageal reflux disease with esophagitis, without bleeding: Secondary | ICD-10-CM

## 2013-09-24 DIAGNOSIS — M810 Age-related osteoporosis without current pathological fracture: Secondary | ICD-10-CM | POA: Diagnosis present

## 2013-09-24 DIAGNOSIS — Z7982 Long term (current) use of aspirin: Secondary | ICD-10-CM

## 2013-09-24 DIAGNOSIS — I10 Essential (primary) hypertension: Secondary | ICD-10-CM | POA: Diagnosis present

## 2013-09-24 DIAGNOSIS — Z79899 Other long term (current) drug therapy: Secondary | ICD-10-CM

## 2013-09-24 HISTORY — PX: LAPAROSCOPIC NISSEN FUNDOPLICATION: SHX1932

## 2013-09-24 LAB — CBC
HEMATOCRIT: 39.3 % (ref 36.0–46.0)
Hemoglobin: 13.2 g/dL (ref 12.0–15.0)
MCH: 26.9 pg (ref 26.0–34.0)
MCHC: 33.6 g/dL (ref 30.0–36.0)
MCV: 80 fL (ref 78.0–100.0)
PLATELETS: 268 10*3/uL (ref 150–400)
RBC: 4.91 MIL/uL (ref 3.87–5.11)
RDW: 14.5 % (ref 11.5–15.5)
WBC: 14.2 10*3/uL — ABNORMAL HIGH (ref 4.0–10.5)

## 2013-09-24 LAB — CREATININE, SERUM
Creatinine, Ser: 0.83 mg/dL (ref 0.50–1.10)
GFR calc non Af Amer: 75 mL/min — ABNORMAL LOW (ref >90)
GFR, EST AFRICAN AMERICAN: 86 mL/min — AB (ref >90)

## 2013-09-24 SURGERY — FUNDOPLICATION, NISSEN, LAPAROSCOPIC
Anesthesia: General | Site: Abdomen

## 2013-09-24 MED ORDER — LISINOPRIL 10 MG PO TABS
10.0000 mg | ORAL_TABLET | Freq: Every morning | ORAL | Status: DC
  Administered 2013-09-24 – 2013-09-26 (×3): 10 mg via ORAL
  Filled 2013-09-24 (×3): qty 1

## 2013-09-24 MED ORDER — NEOSTIGMINE METHYLSULFATE 1 MG/ML IJ SOLN
INTRAMUSCULAR | Status: DC | PRN
  Administered 2013-09-24: 5 mg via INTRAVENOUS

## 2013-09-24 MED ORDER — KCL IN DEXTROSE-NACL 20-5-0.45 MEQ/L-%-% IV SOLN
INTRAVENOUS | Status: AC
  Administered 2013-09-24 – 2013-09-25 (×3): via INTRAVENOUS
  Filled 2013-09-24 (×3): qty 1000

## 2013-09-24 MED ORDER — HYDROMORPHONE HCL PF 1 MG/ML IJ SOLN
INTRAMUSCULAR | Status: DC | PRN
  Administered 2013-09-24 (×2): 0.5 mg via INTRAVENOUS

## 2013-09-24 MED ORDER — HYDROMORPHONE HCL PF 2 MG/ML IJ SOLN
INTRAMUSCULAR | Status: AC
  Filled 2013-09-24: qty 1

## 2013-09-24 MED ORDER — ACETAMINOPHEN 10 MG/ML IV SOLN
1000.0000 mg | INTRAVENOUS | Status: AC
  Administered 2013-09-24: 1000 mg via INTRAVENOUS
  Filled 2013-09-24: qty 100

## 2013-09-24 MED ORDER — UNJURY CHICKEN SOUP POWDER: 2.0000 [oz_av] | Freq: Four times a day (QID) | ORAL | Status: DC

## 2013-09-24 MED ORDER — DEXAMETHASONE SODIUM PHOSPHATE 10 MG/ML IJ SOLN
INTRAMUSCULAR | Status: AC
  Filled 2013-09-24: qty 1

## 2013-09-24 MED ORDER — LACTATED RINGERS IV SOLN
INTRAVENOUS | Status: DC | PRN
  Administered 2013-09-24 (×2): via INTRAVENOUS

## 2013-09-24 MED ORDER — ONDANSETRON HCL 4 MG/2ML IJ SOLN
4.0000 mg | INTRAMUSCULAR | Status: DC | PRN
  Administered 2013-09-25: 4 mg via INTRAVENOUS
  Filled 2013-09-24: qty 2

## 2013-09-24 MED ORDER — EPHEDRINE SULFATE 50 MG/ML IJ SOLN
INTRAMUSCULAR | Status: DC | PRN
  Administered 2013-09-24: 10 mg via INTRAVENOUS
  Administered 2013-09-24: 5 mg via INTRAVENOUS

## 2013-09-24 MED ORDER — GLYCOPYRROLATE 0.2 MG/ML IJ SOLN
INTRAMUSCULAR | Status: AC
  Filled 2013-09-24: qty 3

## 2013-09-24 MED ORDER — GLYCOPYRROLATE 0.2 MG/ML IJ SOLN
INTRAMUSCULAR | Status: DC | PRN
  Administered 2013-09-24: 0.6 mg via INTRAVENOUS

## 2013-09-24 MED ORDER — MIDAZOLAM HCL 2 MG/2ML IJ SOLN
INTRAMUSCULAR | Status: AC
  Filled 2013-09-24: qty 2

## 2013-09-24 MED ORDER — CEFAZOLIN SODIUM-DEXTROSE 2-3 GM-% IV SOLR
INTRAVENOUS | Status: AC
  Filled 2013-09-24: qty 50

## 2013-09-24 MED ORDER — PROPOFOL 10 MG/ML IV BOLUS
INTRAVENOUS | Status: AC
  Filled 2013-09-24: qty 20

## 2013-09-24 MED ORDER — UNJURY CHOCOLATE CLASSIC POWDER: 2.0000 [oz_av] | Freq: Four times a day (QID) | ORAL | Status: DC

## 2013-09-24 MED ORDER — LIDOCAINE HCL (CARDIAC) 20 MG/ML IV SOLN
INTRAVENOUS | Status: AC
  Filled 2013-09-24: qty 5

## 2013-09-24 MED ORDER — LACTATED RINGERS IR SOLN
Status: DC | PRN
  Administered 2013-09-24: 1000 mL

## 2013-09-24 MED ORDER — PHENYLEPHRINE 40 MCG/ML (10ML) SYRINGE FOR IV PUSH (FOR BLOOD PRESSURE SUPPORT)
PREFILLED_SYRINGE | INTRAVENOUS | Status: AC
  Filled 2013-09-24: qty 10

## 2013-09-24 MED ORDER — KETAMINE HCL 10 MG/ML IJ SOLN
INTRAMUSCULAR | Status: DC | PRN
  Administered 2013-09-24: 30 mg via INTRAVENOUS

## 2013-09-24 MED ORDER — ROCURONIUM BROMIDE 100 MG/10ML IV SOLN
INTRAVENOUS | Status: DC | PRN
  Administered 2013-09-24: 10 mg via INTRAVENOUS
  Administered 2013-09-24: 50 mg via INTRAVENOUS
  Administered 2013-09-24: 10 mg via INTRAVENOUS

## 2013-09-24 MED ORDER — HYDROMORPHONE HCL PF 1 MG/ML IJ SOLN
0.5000 mg | INTRAMUSCULAR | Status: DC | PRN
  Administered 2013-09-24 – 2013-09-25 (×8): 0.5 mg via INTRAVENOUS
  Filled 2013-09-24 (×8): qty 1

## 2013-09-24 MED ORDER — METOCLOPRAMIDE HCL 5 MG/ML IJ SOLN
INTRAMUSCULAR | Status: AC
  Filled 2013-09-24: qty 2

## 2013-09-24 MED ORDER — SUCCINYLCHOLINE CHLORIDE 20 MG/ML IJ SOLN
INTRAMUSCULAR | Status: DC | PRN
  Administered 2013-09-24: 100 mg via INTRAVENOUS

## 2013-09-24 MED ORDER — NEOSTIGMINE METHYLSULFATE 1 MG/ML IJ SOLN
INTRAMUSCULAR | Status: AC
  Filled 2013-09-24: qty 10

## 2013-09-24 MED ORDER — ONDANSETRON HCL 4 MG/2ML IJ SOLN
INTRAMUSCULAR | Status: DC | PRN
  Administered 2013-09-24: 4 mg via INTRAVENOUS

## 2013-09-24 MED ORDER — ACETAMINOPHEN 160 MG/5ML PO SOLN
325.0000 mg | ORAL | Status: DC | PRN
Start: 2013-09-24 — End: 2013-09-26

## 2013-09-24 MED ORDER — ROCURONIUM BROMIDE 100 MG/10ML IV SOLN
INTRAVENOUS | Status: AC
  Filled 2013-09-24: qty 1

## 2013-09-24 MED ORDER — KETAMINE HCL 10 MG/ML IJ SOLN
INTRAMUSCULAR | Status: AC
  Filled 2013-09-24: qty 1

## 2013-09-24 MED ORDER — EPHEDRINE SULFATE 50 MG/ML IJ SOLN
INTRAMUSCULAR | Status: AC
  Filled 2013-09-24: qty 1

## 2013-09-24 MED ORDER — CEFAZOLIN SODIUM-DEXTROSE 2-3 GM-% IV SOLR
2.0000 g | INTRAVENOUS | Status: AC
  Administered 2013-09-24: 2 g via INTRAVENOUS

## 2013-09-24 MED ORDER — ACETAMINOPHEN 160 MG/5ML PO SOLN: 650.0000 mg | ORAL | Status: DC | PRN

## 2013-09-24 MED ORDER — FENTANYL CITRATE 0.05 MG/ML IJ SOLN
INTRAMUSCULAR | Status: DC | PRN
  Administered 2013-09-24: 100 ug via INTRAVENOUS
  Administered 2013-09-24 (×3): 50 ug via INTRAVENOUS

## 2013-09-24 MED ORDER — FENTANYL CITRATE 0.05 MG/ML IJ SOLN
INTRAMUSCULAR | Status: AC
  Filled 2013-09-24: qty 5

## 2013-09-24 MED ORDER — METOPROLOL SUCCINATE ER 50 MG PO TB24
50.0000 mg | ORAL_TABLET | Freq: Every morning | ORAL | Status: DC
  Administered 2013-09-25 – 2013-09-26 (×2): 50 mg via ORAL
  Filled 2013-09-24 (×2): qty 1

## 2013-09-24 MED ORDER — UNJURY VANILLA POWDER
2.0000 [oz_av] | Freq: Four times a day (QID) | ORAL | Status: DC
  Administered 2013-09-26 (×2): 2 [oz_av] via ORAL

## 2013-09-24 MED ORDER — PROMETHAZINE HCL 25 MG/ML IJ SOLN: 6.2500 mg | INTRAMUSCULAR | Status: DC | PRN

## 2013-09-24 MED ORDER — HEPARIN SODIUM (PORCINE) 5000 UNIT/ML IJ SOLN
5000.0000 [IU] | Freq: Once | INTRAMUSCULAR | Status: AC
  Administered 2013-09-24: 5000 [IU] via SUBCUTANEOUS
  Filled 2013-09-24: qty 1

## 2013-09-24 MED ORDER — PHENYLEPHRINE HCL 10 MG/ML IJ SOLN
INTRAMUSCULAR | Status: AC
  Filled 2013-09-24: qty 1

## 2013-09-24 MED ORDER — PHENYLEPHRINE HCL 10 MG/ML IJ SOLN
INTRAMUSCULAR | Status: DC | PRN
  Administered 2013-09-24 (×4): 80 ug via INTRAVENOUS

## 2013-09-24 MED ORDER — HYDROMORPHONE HCL PF 1 MG/ML IJ SOLN
INTRAMUSCULAR | Status: AC
  Filled 2013-09-24: qty 1

## 2013-09-24 MED ORDER — HYDROMORPHONE HCL PF 1 MG/ML IJ SOLN
0.2500 mg | INTRAMUSCULAR | Status: DC | PRN
  Administered 2013-09-24 (×4): 0.5 mg via INTRAVENOUS

## 2013-09-24 MED ORDER — DEXTROSE 5 % IV SOLN
10.0000 mg | INTRAVENOUS | Status: DC | PRN
  Administered 2013-09-24: 25 ug/min via INTRAVENOUS

## 2013-09-24 MED ORDER — DEXAMETHASONE SODIUM PHOSPHATE 10 MG/ML IJ SOLN
INTRAMUSCULAR | Status: DC | PRN
  Administered 2013-09-24: 10 mg via INTRAVENOUS

## 2013-09-24 MED ORDER — LIDOCAINE HCL (CARDIAC) 20 MG/ML IV SOLN
INTRAVENOUS | Status: DC | PRN
  Administered 2013-09-24: 100 mg via INTRAVENOUS

## 2013-09-24 MED ORDER — PROPOFOL 10 MG/ML IV BOLUS
INTRAVENOUS | Status: DC | PRN
  Administered 2013-09-24: 150 mg via INTRAVENOUS

## 2013-09-24 MED ORDER — METOCLOPRAMIDE HCL 5 MG/ML IJ SOLN
INTRAMUSCULAR | Status: DC | PRN
  Administered 2013-09-24: 10 mg via INTRAVENOUS

## 2013-09-24 MED ORDER — MIDAZOLAM HCL 5 MG/5ML IJ SOLN
INTRAMUSCULAR | Status: DC | PRN
  Administered 2013-09-24: 2 mg via INTRAVENOUS

## 2013-09-24 MED ORDER — MEPERIDINE HCL 50 MG/ML IJ SOLN: 6.2500 mg | INTRAMUSCULAR | Status: DC | PRN

## 2013-09-24 MED ORDER — BUPIVACAINE LIPOSOME 1.3 % IJ SUSP
20.0000 mL | Freq: Once | INTRAMUSCULAR | Status: AC
  Administered 2013-09-24: 20 mL
  Filled 2013-09-24: qty 20

## 2013-09-24 MED ORDER — ONDANSETRON HCL 4 MG/2ML IJ SOLN
INTRAMUSCULAR | Status: AC
  Filled 2013-09-24: qty 2

## 2013-09-24 MED ORDER — HEPARIN SODIUM (PORCINE) 5000 UNIT/ML IJ SOLN
5000.0000 [IU] | Freq: Three times a day (TID) | INTRAMUSCULAR | Status: DC
  Administered 2013-09-24 – 2013-09-26 (×6): 5000 [IU] via SUBCUTANEOUS
  Filled 2013-09-24 (×8): qty 1

## 2013-09-24 MED ORDER — OXYCODONE HCL 5 MG/5ML PO SOLN
5.0000 mg | ORAL | Status: DC | PRN
  Administered 2013-09-25: 10 mg via ORAL
  Administered 2013-09-25: 5 mg via ORAL
  Administered 2013-09-26 (×2): 10 mg via ORAL
  Filled 2013-09-24 (×4): qty 10

## 2013-09-24 MED ORDER — SODIUM CHLORIDE 0.9 % IJ SOLN
INTRAMUSCULAR | Status: AC
  Filled 2013-09-24: qty 10

## 2013-09-24 SURGICAL SUPPLY — 56 items
APPLIER CLIP ROT 10 11.4 M/L (STAPLE)
BENZOIN TINCTURE PRP APPL 2/3 (GAUZE/BANDAGES/DRESSINGS) IMPLANT
CABLE HIGH FREQUENCY MONO STRZ (ELECTRODE) IMPLANT
CANISTER SUCTION 2500CC (MISCELLANEOUS) IMPLANT
CLAMP ENDO BABCK 10MM (STAPLE) IMPLANT
CLIP APPLIE ROT 10 11.4 M/L (STAPLE) IMPLANT
CLOSURE WOUND 1/2 X4 (GAUZE/BANDAGES/DRESSINGS)
COVER SURGICAL LIGHT HANDLE (MISCELLANEOUS) IMPLANT
DECANTER SPIKE VIAL GLASS SM (MISCELLANEOUS) IMPLANT
DEVICE SUT QUICK LOAD TK 5 (STAPLE) ×8 IMPLANT
DEVICE SUT TI-KNOT TK 5X26 (MISCELLANEOUS) ×2 IMPLANT
DEVICE SUTURE ENDOST 10MM (ENDOMECHANICALS) ×3 IMPLANT
DEVICE TI KNOT TK5 (MISCELLANEOUS) ×1
DISSECTOR BLUNT TIP ENDO 5MM (MISCELLANEOUS) ×3 IMPLANT
DRAIN PENROSE 18X1/2 LTX STRL (DRAIN) ×3 IMPLANT
DRAPE LAPAROSCOPIC ABDOMINAL (DRAPES) ×3 IMPLANT
ELECT REM PT RETURN 9FT ADLT (ELECTROSURGICAL) ×3
ELECTRODE REM PT RTRN 9FT ADLT (ELECTROSURGICAL) ×1 IMPLANT
FILTER SMOKE EVAC LAPAROSHD (FILTER) IMPLANT
GLOVE BIOGEL M 8.0 STRL (GLOVE) ×3 IMPLANT
GLOVE BIOGEL PI IND STRL 7.0 (GLOVE) ×4 IMPLANT
GLOVE BIOGEL PI INDICATOR 7.0 (GLOVE) ×8
GOWN STRL REUS W/TWL LRG LVL3 (GOWN DISPOSABLE) IMPLANT
GOWN STRL REUS W/TWL XL LVL3 (GOWN DISPOSABLE) ×15 IMPLANT
GRASPER ENDO BABCOCK 10 (MISCELLANEOUS) IMPLANT
GRASPER ENDO BABCOCK 10MM (MISCELLANEOUS)
KIT BASIN OR (CUSTOM PROCEDURE TRAY) ×3 IMPLANT
NS IRRIG 1000ML POUR BTL (IV SOLUTION) ×3 IMPLANT
PENCIL BUTTON HOLSTER BLD 10FT (ELECTRODE) IMPLANT
QUICK LOAD TK 5 (STAPLE) ×4
SCALPEL HARMONIC ACE (MISCELLANEOUS) ×3 IMPLANT
SCISSORS LAP 5X35 DISP (ENDOMECHANICALS) ×3 IMPLANT
SET IRRIG TUBING LAPAROSCOPIC (IRRIGATION / IRRIGATOR) ×3 IMPLANT
SLEEVE ADV FIXATION 5X100MM (TROCAR) ×6 IMPLANT
SLEEVE Z-THREAD 5X100MM (TROCAR) IMPLANT
SOLUTION ANTI FOG 6CC (MISCELLANEOUS) ×6 IMPLANT
STAPLER VISISTAT 35W (STAPLE) ×3 IMPLANT
STRIP CLOSURE SKIN 1/2X4 (GAUZE/BANDAGES/DRESSINGS) IMPLANT
SUT DEVICE BRAIDED 0X39 (SUTURE) ×12 IMPLANT
SUT DEVICE BRAIDED 2.0X39 (SUTURE) ×3 IMPLANT
SUT SURGIDAC NAB ES-9 0 48 120 (SUTURE) ×9 IMPLANT
SUT VIC AB 4-0 SH 18 (SUTURE) ×3 IMPLANT
SYR 30ML LL (SYRINGE) ×3 IMPLANT
TIP INNERVISION DETACH 40FR (MISCELLANEOUS) IMPLANT
TIP INNERVISION DETACH 50FR (MISCELLANEOUS) IMPLANT
TIP INNERVISION DETACH 56FR (MISCELLANEOUS) ×3 IMPLANT
TIPS INNERVISION DETACH 40FR (MISCELLANEOUS)
TRAY FOLEY CATH 14FRSI W/METER (CATHETERS) ×3 IMPLANT
TRAY LAP CHOLE (CUSTOM PROCEDURE TRAY) ×3 IMPLANT
TROCAR ADV FIXATION 11X100MM (TROCAR) ×6 IMPLANT
TROCAR ADV FIXATION 5X100MM (TROCAR) ×6 IMPLANT
TROCAR BLADELESS OPT 5 100 (ENDOMECHANICALS) ×3 IMPLANT
TROCAR XCEL BLUNT TIP 100MML (ENDOMECHANICALS) IMPLANT
TROCAR XCEL NON-BLD 11X100MML (ENDOMECHANICALS) IMPLANT
TROCAR XCEL UNIV SLVE 11M 100M (ENDOMECHANICALS) IMPLANT
TUBING FILTER THERMOFLATOR (ELECTROSURGICAL) ×3 IMPLANT

## 2013-09-24 NOTE — Interval H&P Note (Signed)
History and Physical Interval Note:  09/24/2013 7:28 AM  Kim Warren  has presented today for surgery, with the diagnosis of GERD  The various methods of treatment have been discussed with the patient and family. After consideration of risks, benefits and other options for treatment, the patient has consented to  Procedure(s): [place] (N/A) as a surgical intervention .  The patient's history has been reviewed, patient examined, no change in status, stable for surgery.  I have reviewed the patient's chart and labs.  Questions were answered to the patient's satisfaction.     Kim Warren B

## 2013-09-24 NOTE — Transfer of Care (Signed)
Immediate Anesthesia Transfer of Care Note  Patient: Kim Warren  Procedure(s) Performed: Procedure(s) (LRB): LAPAROSCOPIC NISSEN AND HIATAL HERNIA REPAIR (N/A)  Patient Location: PACU  Anesthesia Type: General  Level of Consciousness: sedated, patient cooperative and responds to stimulation  Airway & Oxygen Therapy: Patient Spontanous Breathing and Patient connected to face mask oxgen  Post-op Assessment: Report given to PACU RN and Post -op Vital signs reviewed and stable  Post vital signs: Reviewed and stable  Complications: No apparent anesthesia complications

## 2013-09-24 NOTE — Op Note (Signed)
Surgeon: Kaylyn Lim, MD, FACS  Asst:  Armandina Gemma, MD, FACS  Anes:  general  Procedure: Laparoscopic enterolysis, Lap Nissen over 56 dilator with 1 suture closure of the hiatus  Diagnosis: GERD  Complications: non  EBL:   20 cc  Description of Procedure:  The patient was taken to OR 11 given general anesthesia. The abdomen was prepped with PCMX and draped sterilely. Access to the abdomen was achieved through the left upper quadrant using a 5 mm 0 Optiview technique without difficulty. The abdomen was insufflated in standard trocar placements were used. R case oh upgrade of the left periumbilical trocar to light 11 to accommodate the 10 mm scope. Also the one to the right of midline was a 12 to accommodate the Endo 360.  The operation began with taking down adhesions that appeared to be kinking the outflow of the stomach. I felt this could be causing an aggravating her reflux to using sharp dissection mainly in some harmonic dissection we took down adhesions from her old open cholecystectomy that were binding up her pyloric region. Once this was free we put a 5 mm Nathanson retractor in and retracted the liver.  Dissection of the foregut revealed a very stuck esophagus to the surrounding tissue we're able to free this satisfactorily to take down the adhesions to the upper stomach to enable me to do a tension-free wrap. This was done with harmonic scalpel. Bleeding was controlled. Following this we dissected the posterior right and left crura and found a small defect but not much. I went ahead and repair that with a single 0 surgidec with Endoclose 360.  Following this the lighted 56 bougie was passed without difficulty and around this a wrap of the distal esophagus was performed using a 0 Surgidek Pl. With the Endoloop 360 and secured with Ty knots. 3 such sutures were placed taking a purchase of periesophageal tissue with each. The 56 dilator was withdrawn. Good healthy-looking wrap was present.   Ports were closed 4-0 Vicryl after being injected with Exparel and closed with Dermabond.  Matt B. Hassell Done, [place], [facility], [place]

## 2013-09-24 NOTE — Preoperative (Signed)
Beta Blockers   Reason not to administer Beta Blockers:Not Applicable Patient took Beta Blocker 09-24-13 AM

## 2013-09-24 NOTE — Anesthesia Postprocedure Evaluation (Signed)
Anesthesia Post Note  Patient: Kim Warren  Procedure(s) Performed: Procedure(s) (LRB): LAPAROSCOPIC NISSEN AND HIATAL HERNIA REPAIR (N/A)  Anesthesia type: General  Patient location: PACU  Post pain: Pain level controlled  Post assessment: Post-op Vital signs reviewed  Last Vitals: BP 134/79  Pulse 89  Temp(Src) 36.7 C (Oral)  Resp 15  SpO2 97%  Post vital signs: Reviewed  Level of consciousness: sedated  Complications: No apparent anesthesia complications

## 2013-09-24 NOTE — H&P (View-Only) (Signed)
Chief Complaint:  GERD, osteoporosis, hoarseness, severe reflux  History of Present Illness:  Kim Warren is an 63 y.o. female seen by me in December with a very significant symptoms of GERD including nocturnal reflux hoarseness and ear pain. We have discussed lap Nissen before and she decided she wants to pursue this. I described the procedure to her in some detail and she elected go ahead and get this scheduled.  Past Medical History  Diagnosis Date  . Hypertension   . Hypothyroidism   . GERD (gastroesophageal reflux disease)   . History of kidney stones   . Arthritis   . Headache(784.0)     sinus headaches-seasonal  . Onychomycosis   . Neuromuscular disorder     fibromyalgia  . Dysphagia   . Palpitations     Past Surgical History  Procedure Laterality Date  . Cholecystectomy    . Periosteal chondroma  1979    left middle finger-benign  . Abdominal hysterectomy  1990    partial  . Dilation and curettage of uterus    . Brain surgery  1995    meningioma-benign  . Parathyroid exploration  2011    tumor off-benign  . Esophagogastroduodenoscopy (egd) with propofol N/A 05/09/2013    Procedure: ESOPHAGOGASTRODUODENOSCOPY (EGD) WITH PROPOFOL;  Surgeon: Arta Silence, MD;  Location: [facility];  Service: Endoscopy;  Laterality: N/A;  . Bravo ph study N/A 05/09/2013    Procedure: BRAVO [place];  Surgeon: Arta Silence, MD;  Location: [facility];  Service: Endoscopy;  Laterality: N/A;  . Colonoscopy  06/08/06  . Wisdom tooth extraction      Current Outpatient Prescriptions  Medication Sig Dispense Refill  . aspirin EC 81 MG tablet Take 81 mg by mouth daily.      . Biotin 5000 MCG TABS Take 1 tablet by mouth daily.      Marland Kitchen bismuth subsalicylate (PEPTO BISMOL) 262 MG/15ML suspension Take 30 mLs by mouth every 6 (six) hours as needed for indigestion.      . Coenzyme Q10 (COQ10) 100 MG CAPS Take by mouth.      . cyclobenzaprine (FLEXERIL) 10 MG tablet Take 10 mg by mouth 3  (three) times daily.      . diphenoxylate-atropine (LOMOTIL) 2.5-0.025 MG per tablet Take 1 tablet by mouth 4 (four) times daily as needed for diarrhea or loose stools.      Marland Kitchen esomeprazole (NEXIUM) 40 MG capsule Take 40 mg by mouth 2 (two) times daily.      . fenofibrate 160 MG tablet Take 160 mg by mouth at bedtime.      . hydrochlorothiazide (HYDRODIURIL) 25 MG tablet Take 25 mg by mouth every morning.      Marland Kitchen HYDROcodone-acetaminophen (NORCO) 10-325 MG per tablet Take 1 tablet by mouth 3 (three) times daily.      Marland Kitchen levothyroxine (SYNTHROID, LEVOTHROID) 100 MCG tablet Take 100 mcg by mouth daily before breakfast.      . lisinopril (PRINIVIL,ZESTRIL) 10 MG tablet Take 10 mg by mouth every morning.      . loratadine (CLARITIN) 10 MG tablet Take 10 mg by mouth every morning.      . meloxicam (MOBIC) 7.5 MG tablet Take 7.5 mg by mouth every morning.      . methylPREDNIsolone (MEDROL DOSPACK) 4 MG tablet       . metoprolol succinate (TOPROL-XL) 50 MG 24 hr tablet Take 50 mg by mouth daily. Take with or immediately following a meal.      .  Omega-3 Fatty Acids (FISH OIL) 1200 MG CAPS Take 1 capsule by mouth daily.      . pseudoephedrine (SUDAFED) 30 MG tablet Take 30 mg by mouth every 4 (four) hours as needed for congestion.      Marland Kitchen zolpidem (AMBIEN) 10 MG tablet Take 10 mg by mouth at bedtime as needed for sleep.       No current facility-administered medications for this visit.   Erythromycin and Morphine and related Family History  Problem Relation Age of Onset  . Cancer Mother     colon cancer?  . Heart disease Father    Social History:   reports that she has never smoked. She does not have any smokeless tobacco history on file. She reports that she does not drink alcohol or use illicit drugs.   REVIEW OF SYSTEMS - PERTINENT POSITIVES ONLY: No history of DVT  Physical Exam:   Blood pressure 134/86, pulse 78, temperature 98.8 F (37.1 C), temperature source Oral, resp. rate 16, height 5'  (1.524 m), weight 172 lb 3.2 oz (78.109 kg). Body mass index is 33.63 kg/(m^2).  Gen:  WDWN white female NAD  Neurological: Alert and oriented to person, place, and time. Motor and sensory function is grossly intact  Head: Normocephalic and atraumatic.  Eyes: Conjunctivae are normal. Pupils are equal, round, and reactive to light. No scleral icterus.  Neck: Normal range of motion. Neck supple. No tracheal deviation or thyromegaly present.  Cardiovascular:  SR without murmurs or gallops.  No carotid bruits Respiratory: Effort normal.  No respiratory distress. No chest wall tenderness. Breath sounds normal.  No wheezes, rales or rhonchi.  Abdomen:  nontender GU: Musculoskeletal: Normal range of motion. Extremities are nontender. No cyanosis, edema or clubbing noted Lymphadenopathy: No cervical, preauricular, postauricular or axillary adenopathy is present Skin: Skin is warm and dry. No rash noted. No diaphoresis. No erythema. No pallor. Pscyh: Normal mood and affect. Behavior is normal. Judgment and thought content normal.   LABORATORY RESULTS: No results found for this or any previous visit (from the past 48 hour(s)).  RADIOLOGY RESULTS: No results found.  Problem List: Patient Active Problem List   Diagnosis Date Noted  . GERD (gastroesophageal reflux disease) 06/27/2013    Assessment & Plan: Symptomatic gastroesophageal reflux disease severe plan laparoscopic Nissen fundoplication and hiatal hernia repair.    Matt B. Hassell Done, MD, [facility] [phone] beeper [phone]  08/31/2013 3:45 PM

## 2013-09-25 ENCOUNTER — Encounter (HOSPITAL_COMMUNITY): Payer: Self-pay | Admitting: Surgery

## 2013-09-25 ENCOUNTER — Inpatient Hospital Stay (HOSPITAL_COMMUNITY): Payer: Medicare HMO

## 2013-09-25 LAB — CBC WITH DIFFERENTIAL/PLATELET
BASOS ABS: 0 10*3/uL (ref 0.0–0.1)
BASOS PCT: 0 % (ref 0–1)
EOS PCT: 0 % (ref 0–5)
Eosinophils Absolute: 0 10*3/uL (ref 0.0–0.7)
HEMATOCRIT: 37.9 % (ref 36.0–46.0)
HEMOGLOBIN: 12.8 g/dL (ref 12.0–15.0)
Lymphocytes Relative: 22 % (ref 12–46)
Lymphs Abs: 3.4 10*3/uL (ref 0.7–4.0)
MCH: 26.9 pg (ref 26.0–34.0)
MCHC: 33.8 g/dL (ref 30.0–36.0)
MCV: 79.8 fL (ref 78.0–100.0)
MONO ABS: 1.3 10*3/uL — AB (ref 0.1–1.0)
Monocytes Relative: 8 % (ref 3–12)
Neutro Abs: 11 10*3/uL — ABNORMAL HIGH (ref 1.7–7.7)
Neutrophils Relative %: 70 % (ref 43–77)
Platelets: 307 10*3/uL (ref 150–400)
RBC: 4.75 MIL/uL (ref 3.87–5.11)
RDW: 14.6 % (ref 11.5–15.5)
WBC: 15.7 10*3/uL — ABNORMAL HIGH (ref 4.0–10.5)

## 2013-09-25 MED ORDER — IOHEXOL 300 MG/ML  SOLN
50.0000 mL | Freq: Once | INTRAMUSCULAR | Status: AC | PRN
  Administered 2013-09-25: 35 mL via ORAL

## 2013-09-25 NOTE — Progress Notes (Signed)
Patient ID: Kim Warren, female   DOB: 02-Jan-1951, 63 y.o.   MRN: [id number] [facility] Progress Note:   1 Day Post-Op  Subjective: Mental status is clear Objective: Vital signs in last 24 hours: Temp:  [98 F (36.7 C)-98.3 F (36.8 C)] 98.3 F (36.8 C) (03/31 0513) Pulse Rate:  [92-100] 92 (03/31 0513) Resp:  [18-20] 18 (03/31 0513) BP: (130-156)/(80-85) 156/85 mmHg (03/31 0513) SpO2:  [91 %-96 %] 96 % (03/31 0513)  Intake/Output from previous day: 03/30 0701 - 03/31 0700 In: 3806.7 [P.O.:60; I.V.:3746.7] Out: 3200 [Urine:3175; Blood:25] Intake/Output this shift: Total I/O In: 460 [P.O.:60; I.V.:400] Out: 500 [Urine:500]  Physical Exam: Work of breathing is normal.  Swallow OK  Incisions ok  Lab Results:  Results for orders placed during the hospital encounter of 09/24/13 (from the past 48 hour(s))  CBC     Status: Abnormal   Collection Time    09/24/13 12:26 PM      Result Value Ref Range   WBC 14.2 (*) 4.0 - 10.5 K/uL   RBC 4.91  3.87 - 5.11 MIL/uL   Hemoglobin 13.2  12.0 - 15.0 g/dL   HCT 39.3  36.0 - 46.0 %   MCV 80.0  78.0 - 100.0 fL   MCH 26.9  26.0 - 34.0 pg   MCHC 33.6  30.0 - 36.0 g/dL   RDW 14.5  11.5 - 15.5 %   Platelets 268  150 - 400 K/uL  CREATININE, SERUM     Status: Abnormal   Collection Time    09/24/13 12:26 PM      Result Value Ref Range   Creatinine, Ser 0.83  0.50 - 1.10 mg/dL   GFR calc non Af Amer 75 (*) >90 mL/min   GFR calc Af Amer 86 (*) >90 mL/min   Comment: (NOTE)     The eGFR has been calculated using the CKD EPI equation.     This calculation has not been validated in all clinical situations.     eGFR's persistently <90 mL/min signify possible Chronic Kidney     Disease.  CBC WITH DIFFERENTIAL     Status: Abnormal   Collection Time    09/25/13  4:14 AM      Result Value Ref Range   WBC 15.7 (*) 4.0 - 10.5 K/uL   RBC 4.75  3.87 - 5.11 MIL/uL   Hemoglobin 12.8  12.0 - 15.0 g/dL   HCT 37.9  36.0 - 46.0 %   MCV 79.8   78.0 - 100.0 fL   MCH 26.9  26.0 - 34.0 pg   MCHC 33.8  30.0 - 36.0 g/dL   RDW 14.6  11.5 - 15.5 %   Platelets 307  150 - 400 K/uL   Neutrophils Relative % 70  43 - 77 %   Neutro Abs 11.0 (*) 1.7 - 7.7 K/uL   Lymphocytes Relative 22  12 - 46 %   Lymphs Abs 3.4  0.7 - 4.0 K/uL   Monocytes Relative 8  3 - 12 %   Monocytes Absolute 1.3 (*) 0.1 - 1.0 K/uL   Eosinophils Relative 0  0 - 5 %   Eosinophils Absolute 0.0  0.0 - 0.7 K/uL   Basophils Relative 0  0 - 1 %   Basophils Absolute 0.0  0.0 - 0.1 K/uL    Radiology/Results: Dg Ugi W/water Sol Cm  09/25/2013   CLINICAL DATA:  Status post Nissen fundoplication.  EXAM: WATER SOLUBLE UPPER GI  SERIES  TECHNIQUE: Single-column upper GI series was performed using water soluble contrast.  CONTRAST:  35 mL OMNIPAQUE IOHEXOL 300 MG/ML  SOLN  COMPARISON:  CT chest 07/18/2013.  FLUOROSCOPY TIME:  26 seconds.  FINDINGS: Scout film demonstrates a normal bowel gas pattern. Cholecystectomy clips are noted. Thoracic spondylosis is identified. No leakage of contrast material is identified. Contrast flows into the stomach.  IMPRESSION: Status post hiatal hernia repair without evidence of complication. No leakage of contrast is identified.   Electronically Signed   By: Inge Rise M.D.   On: 09/25/2013 11:22    Anti-infectives: Anti-infectives   Start     Dose/Rate Route Frequency Ordered Stop   09/24/13 0559  ceFAZolin (ANCEF) IVPB 2 g/50 mL premix     2 g 100 mL/hr over 30 Minutes Intravenous On call to O.R. 09/24/13 0559 09/24/13 0749      Assessment/Plan: Problem List: Patient Active Problem List   Diagnosis Date Noted  . S/P Nissen fundoplication (without gastrostomy tube) procedure 09/24/2013  . GERD (gastroesophageal reflux disease) 06/27/2013    Doing well thus far.  Begin clears 1 Day Post-Op    LOS: 1 day   Matt B. Hassell Done, MD, [facility] [phone] beeper [phone]  09/25/2013 1:37 PM

## 2013-09-26 LAB — CBC WITH DIFFERENTIAL/PLATELET
Basophils Absolute: 0 10*3/uL (ref 0.0–0.1)
Basophils Relative: 0 % (ref 0–1)
EOS ABS: 0.1 10*3/uL (ref 0.0–0.7)
Eosinophils Relative: 1 % (ref 0–5)
HCT: 36.6 % (ref 36.0–46.0)
HEMOGLOBIN: 11.8 g/dL — AB (ref 12.0–15.0)
Lymphocytes Relative: 36 % (ref 12–46)
Lymphs Abs: 3 10*3/uL (ref 0.7–4.0)
MCH: 26.6 pg (ref 26.0–34.0)
MCHC: 32.2 g/dL (ref 30.0–36.0)
MCV: 82.4 fL (ref 78.0–100.0)
MONO ABS: 0.9 10*3/uL (ref 0.1–1.0)
MONOS PCT: 11 % (ref 3–12)
NEUTROS PCT: 52 % (ref 43–77)
Neutro Abs: 4.3 10*3/uL (ref 1.7–7.7)
Platelets: 231 10*3/uL (ref 150–400)
RBC: 4.44 MIL/uL (ref 3.87–5.11)
RDW: 14.9 % (ref 11.5–15.5)
WBC: 8.3 10*3/uL (ref 4.0–10.5)

## 2013-09-26 NOTE — Discharge Summary (Signed)
Physician Discharge Summary  Patient ID: Kim Warren MRN: [id number] DOB/AGE: [birth year] 63 y.o.  Admit date: 09/24/2013 Discharge date: 09/26/2013  Admission Diagnoses:  GERD  Discharge Diagnoses:  same  Active Problems:   S/P Nissen fundoplication (without gastrostomy tube) procedure   Surgery:  Nissen fundoplication  Discharged Condition: improved  Hospital Course:   Had surgery.  Did well.  Swallow OK  Consults: none  Significant Diagnostic Studies: UGI    Discharge Exam: Blood pressure 144/83, pulse 75, temperature 97.4 F (36.3 C), temperature source Oral, resp. rate 18, height 5' (1.524 m), weight 172 lb (78.019 kg), SpO2 94.00%. Incision ok.  Disposition: 01-Home or Self Care  Discharge Orders   Future Orders Complete By Expires   Diet - low sodium heart healthy  As directed    Discharge instructions  As directed    Comments:     May shower ad lib Full liquids by mouth for a week and then pureed diet for 4 weeks   Increase activity slowly  As directed    No wound care  As directed        Medication List    STOP taking these medications       esomeprazole 40 MG capsule  Commonly known as:  [place] these medications       aspirin EC 81 MG tablet  Take 81 mg by mouth daily.     Biotin 5000 MCG Tabs  Take 1 tablet by mouth daily.     bismuth subsalicylate 762 GB/15VV suspension  Commonly known as:  PEPTO BISMOL  Take 30 mLs by mouth every 6 (six) hours as needed for indigestion.     cyclobenzaprine 10 MG tablet  Commonly known as:  FLEXERIL  Take 10 mg by mouth 3 (three) times daily.     fenofibrate 160 MG tablet  Take 160 mg by mouth at bedtime.     FIORICET PO  Take by mouth. IF NEEDED FOR HEADACHES     Fish Oil 1200 MG Caps  Take 1 capsule by mouth daily.     hydrochlorothiazide 25 MG tablet  Commonly known as:  HYDRODIURIL  Take 25 mg by mouth every morning.     HYDROcodone-acetaminophen 10-325 MG per tablet  Commonly  known as:  NORCO  Take 1 tablet by mouth 3 (three) times daily. FOR DEGENERATIVE DISK DISEASE - PT HAS BEEN TAKING FOR YEARS     ibuprofen 200 MG tablet  Commonly known as:  ADVIL,MOTRIN  Take 600 mg by mouth every 6 (six) hours as needed for mild pain or moderate pain.     levothyroxine 100 MCG tablet  Commonly known as:  SYNTHROID, LEVOTHROID  Take 100 mcg by mouth daily before breakfast.     lisinopril 10 MG tablet  Commonly known as:  PRINIVIL,ZESTRIL  Take 10 mg by mouth every morning.     loratadine 10 MG tablet  Commonly known as:  CLARITIN  Take 10 mg by mouth every morning.     meloxicam 7.5 MG tablet  Commonly known as:  MOBIC  Take 7.5 mg by mouth every morning.     metoprolol succinate 50 MG 24 hr tablet  Commonly known as:  TOPROL-XL  Take 50 mg by mouth every morning. Take with or immediately following a meal.     naproxen sodium 220 MG tablet  Commonly known as:  ANAPROX  Take 220 mg by mouth. TWICE A DAY AS NEEDED  Follow-up Information   Follow up with Pedro Earls, MD.   Specialty:  General Surgery   Contact information:   [address] [place] [address] [phone]       Signed: Pedro Earls 09/26/2013, 9:02 AM

## 2013-10-07 ENCOUNTER — Encounter (HOSPITAL_COMMUNITY): Payer: Self-pay | Admitting: Emergency Medicine

## 2013-10-07 ENCOUNTER — Emergency Department (HOSPITAL_COMMUNITY)
Admission: EM | Admit: 2013-10-07 | Discharge: 2013-10-07 | Disposition: A | Discharge status: Home / Self Care | Payer: Medicare HMO | Attending: Emergency Medicine | Admitting: Emergency Medicine

## 2013-10-07 DIAGNOSIS — K047 Periapical abscess without sinus: Secondary | ICD-10-CM | POA: Insufficient documentation

## 2013-10-07 DIAGNOSIS — Z79899 Other long term (current) drug therapy: Secondary | ICD-10-CM | POA: Insufficient documentation

## 2013-10-07 DIAGNOSIS — Z791 Long term (current) use of non-steroidal anti-inflammatories (NSAID): Secondary | ICD-10-CM | POA: Insufficient documentation

## 2013-10-07 DIAGNOSIS — Z7982 Long term (current) use of aspirin: Secondary | ICD-10-CM | POA: Insufficient documentation

## 2013-10-07 DIAGNOSIS — M479 Spondylosis, unspecified: Secondary | ICD-10-CM | POA: Insufficient documentation

## 2013-10-07 DIAGNOSIS — I10 Essential (primary) hypertension: Secondary | ICD-10-CM | POA: Insufficient documentation

## 2013-10-07 DIAGNOSIS — Z87442 Personal history of urinary calculi: Secondary | ICD-10-CM | POA: Insufficient documentation

## 2013-10-07 DIAGNOSIS — E039 Hypothyroidism, unspecified: Secondary | ICD-10-CM | POA: Insufficient documentation

## 2013-10-07 DIAGNOSIS — Z8619 Personal history of other infectious and parasitic diseases: Secondary | ICD-10-CM | POA: Insufficient documentation

## 2013-10-07 DIAGNOSIS — Z8669 Personal history of other diseases of the nervous system and sense organs: Secondary | ICD-10-CM | POA: Insufficient documentation

## 2013-10-07 DIAGNOSIS — K0381 Cracked tooth: Secondary | ICD-10-CM | POA: Insufficient documentation

## 2013-10-07 MED ORDER — KETOROLAC TROMETHAMINE 30 MG/ML IJ SOLN
30.0000 mg | Freq: Once | INTRAMUSCULAR | Status: AC
  Administered 2013-10-07: 30 mg via INTRAMUSCULAR
  Filled 2013-10-07: qty 1

## 2013-10-07 MED ORDER — HYDROCODONE-ACETAMINOPHEN 5-325 MG PO TABS: 1.0000 | ORAL_TABLET | ORAL | Status: DC | PRN

## 2013-10-07 MED ORDER — PENICILLIN V POTASSIUM 250 MG PO TABS
500.0000 mg | ORAL_TABLET | Freq: Once | ORAL | Status: AC
  Administered 2013-10-07: 500 mg via ORAL
  Filled 2013-10-07: qty 2

## 2013-10-07 MED ORDER — HYDROMORPHONE HCL PF 1 MG/ML IJ SOLN
1.0000 mg | Freq: Once | INTRAMUSCULAR | Status: AC
  Administered 2013-10-07: 1 mg via INTRAMUSCULAR
  Filled 2013-10-07: qty 1

## 2013-10-07 MED ORDER — PENICILLIN V POTASSIUM 500 MG PO TABS: 500.0000 mg | ORAL_TABLET | Freq: Four times a day (QID) | ORAL | Status: AC

## 2013-10-07 MED ORDER — CLINDAMYCIN HCL 150 MG PO CAPS: 600.0000 mg | ORAL_CAPSULE | Freq: Once | ORAL | Status: DC

## 2013-10-07 NOTE — Discharge Instructions (Signed)
°  Dental Abscess °A dental abscess is a collection of infected fluid (pus) from a bacterial infection in the inner part of the tooth (pulp). It usually occurs at the end of the tooth's root.  °CAUSES  °· Severe tooth decay. °· Trauma to the tooth that allows bacteria to enter into the pulp, such as a broken or chipped tooth. °SYMPTOMS  °· Severe pain in and around the infected tooth. °· Swelling and redness around the abscessed tooth or in the mouth or face. °· Tenderness. °· Pus drainage. °· Bad breath. °· Bitter taste in the mouth. °· Difficulty swallowing. °· Difficulty opening the mouth. °· Nausea. °· Vomiting. °· Chills. °· Swollen neck glands. °DIAGNOSIS  °· A medical and dental history will be taken. °· An examination will be performed by tapping on the abscessed tooth. °· X-rays may be taken of the tooth to identify the abscess. °TREATMENT °The goal of treatment is to eliminate the infection. You may be prescribed antibiotic medicine to stop the infection from spreading. A root canal may be performed to save the tooth. If the tooth cannot be saved, it may be pulled (extracted) and the abscess may be drained.  °HOME CARE INSTRUCTIONS °· Only take over-the-counter or prescription medicines for pain, fever, or discomfort as directed by your caregiver. °· Rinse your mouth (gargle) often with salt water (¼ tsp salt in 8 oz [250 ml] of warm water) to relieve pain or swelling. °· Do not drive after taking pain medicine (narcotics). °· Do not apply heat to the outside of your face. °· Return to your dentist for further treatment as directed. °SEEK MEDICAL CARE IF: °· Your pain is not helped by medicine. °· Your pain is getting worse instead of better. °SEEK IMMEDIATE MEDICAL CARE IF: °· You have a fever or persistent symptoms for more than 2 3 days. °· You have a fever and your symptoms suddenly get worse. °· You have chills or a very bad headache. °· You have problems breathing or swallowing. °· You have trouble  opening your mouth. °· You have swelling in the neck or around the eye. °Document Released: 06/14/2005 Document Revised: 03/08/2012 Document Reviewed: 09/22/2010 °[employer]® Patient Information ©2014 [employer]. ° ° °

## 2013-10-07 NOTE — ED Notes (Addendum)
Pt reports dental pain x1 week.Pt reports had a filling that came off and the tooth cracked underneath. Pt reports pain ever since but reports jaw swelling began last night. nad noted. Airway patent. Pt reports nausea. Pt reports takes norco x3 per day for back pain and "that those did not help with the pain." Pt reports low grade fever last night. Pt afebrile in triage.

## 2013-10-15 NOTE — ED Provider Notes (Signed)
CSN: [id number]     Arrival date & time 10/07/13  1456 History   First MD Initiated Contact with Patient 10/07/13 1526     Chief Complaint  Patient presents with  . Oral Swelling     (Consider location/radiation/quality/duration/timing/severity/associated sxs/prior Treatment) HPI  63 year old female with atraumatic left facial pain and swelling. Onset about a week ago and progressively worsening. Initially had pain. Swelling has began within the past day or so. Previous dental filling to left lower molar which became dislodged showed before the onset of her pain. She is prescribed Norco for back pain. She's been taking this with minimal relief of her facial pain. No fever. No difficulty breathing or swallowing.  Past Medical History  Diagnosis Date  . Hypertension   . Hypothyroidism   . GERD (gastroesophageal reflux disease)   . History of kidney stones   . Arthritis   . Headache(784.0)     sinus headaches-seasonal  . Onychomycosis     LEFT BIG TOE  . Neuromuscular disorder     fibromyalgia  . Dysphagia   . Palpitations   . Dysrhythmia     BENIGN PVCS  . H/O hiatal hernia   . Fibromyalgia   . Degenerative joint disease of spine     SEVERE LOW BACK PAIN-DDD, SOMETIMES PAIN IN LOWER LEGS;  PT ALSO HAS DDD CERVICAL - WITH NECK PAIN THAT RADIATES TO BOTH SHOULDERS AND ARMS AND NUMBNESS FINGERS BOTH HANDS.  AS OF 09/18/13 - PT HAS NOTICED WORSENING OF NECK, SHOULDER PAIN - "SEARING PAIN" & FEELING OF ELECTRICAL SENSATIONS - SHE PLANS TO GET IN TOUCH WITH HER NEUROLOGIST THIS WEEK BEFORE PLANNED NISSEN SURG ON 3/30  . Hoarseness of voice     PT RELATES TO GERD PROBLEM   Past Surgical History  Procedure Laterality Date  . Cholecystectomy    . Periosteal chondroma  1979    left middle finger-benign  . Abdominal hysterectomy  1990    partial  . Dilation and curettage of uterus    . Brain surgery  1995    meningioma-benign  . Parathyroid exploration  2011    tumor off-benign  .  Esophagogastroduodenoscopy (egd) with propofol N/A 05/09/2013    Procedure: ESOPHAGOGASTRODUODENOSCOPY (EGD) WITH PROPOFOL;  Surgeon: Arta Silence, MD;  Location: [facility];  Service: Endoscopy;  Laterality: N/A;  . Bravo ph study N/A 05/09/2013    Procedure: BRAVO [place];  Surgeon: Arta Silence, MD;  Location: [facility];  Service: Endoscopy;  Laterality: N/A;  . Colonoscopy  06/08/06  . Wisdom tooth extraction    . Laparoscopic nissen fundoplication N/A 7/67/3419    Procedure: LAPAROSCOPIC NISSEN AND HIATAL HERNIA REPAIR;  Surgeon: Pedro Earls, MD;  Location: [facility];  Service: General;  Laterality: N/A;   Family History  Problem Relation Age of Onset  . Cancer Mother     colon cancer?  . Heart disease Father    History  Substance Use Topics  . Smoking status: Never Smoker   . Smokeless tobacco: Never Used  . Alcohol Use: No   OB History   Grav Para Term Preterm Abortions TAB SAB Ect Mult Living                 Review of Systems  All systems reviewed and negative, other than as noted in HPI.   Allergies  Erythromycin and Morphine and related  Home Medications   Prior to Admission medications   Medication Sig Start Date End Date Taking? Authorizing  Provider  aspirin EC 81 MG tablet Take 81 mg by mouth daily.   Yes Historical Provider, MD  Biotin 5000 MCG TABS Take 1 tablet by mouth daily.   Yes Historical Provider, MD  butalbital-acetaminophen-caffeine (FIORICET, ESGIC) 50-325-40 MG per tablet Take 1 tablet by mouth every 6 (six) hours as needed for headache.   Yes Historical Provider, MD  cyclobenzaprine (FLEXERIL) 10 MG tablet Take 10 mg by mouth 3 (three) times daily.   Yes Historical Provider, MD  fenofibrate 160 MG tablet Take 160 mg by mouth at bedtime.   Yes Historical Provider, MD  hydrochlorothiazide (HYDRODIURIL) 25 MG tablet Take 25 mg by mouth every morning.   Yes Historical Provider, MD  HYDROcodone-acetaminophen (NORCO) 10-325 MG per tablet  Take 1 tablet by mouth 3 (three) times daily. FOR DEGENERATIVE DISK DISEASE - PT HAS BEEN TAKING FOR YEARS   Yes Historical Provider, MD  ibuprofen (ADVIL,MOTRIN) 200 MG tablet Take 600 mg by mouth every 6 (six) hours as needed for mild pain or moderate pain.   Yes Historical Provider, MD  levothyroxine (SYNTHROID, LEVOTHROID) 100 MCG tablet Take 100 mcg by mouth daily before breakfast.   Yes Historical Provider, MD  lisinopril (PRINIVIL,ZESTRIL) 10 MG tablet Take 10 mg by mouth every morning.   Yes Historical Provider, MD  loratadine (CLARITIN) 10 MG tablet Take 10 mg by mouth every morning.   Yes Historical Provider, MD  meloxicam (MOBIC) 7.5 MG tablet Take 7.5 mg by mouth every morning.   Yes Historical Provider, MD  metoprolol succinate (TOPROL-XL) 50 MG 24 hr tablet Take 50 mg by mouth every morning. Take with or immediately following a meal.   Yes Historical Provider, MD  Omega-3 Fatty Acids (FISH OIL) 1200 MG CAPS Take 1 capsule by mouth daily.   Yes Historical Provider, MD  bismuth subsalicylate (PEPTO BISMOL) 262 MG/15ML suspension Take 30 mLs by mouth every 6 (six) hours as needed for indigestion.    Historical Provider, MD  HYDROcodone-acetaminophen (NORCO/VICODIN) 5-325 MG per tablet Take 1-2 tablets by mouth every 4 (four) hours as needed. 10/07/13   Virgel Manifold, MD  naproxen sodium (ANAPROX) 220 MG tablet Take 220 mg by mouth. TWICE A DAY AS NEEDED    Historical Provider, MD   BP 135/82  Pulse 95  Temp(Src) 97.8 F (36.6 C) (Oral)  Resp 15  Ht 5' (1.524 m)  Wt 172 lb (78.019 kg)  BMI 33.59 kg/m2  SpO2 96% Physical Exam  Nursing note and vitals reviewed. Constitutional: She appears well-developed and well-nourished. No distress.  HENT:  Head: Normocephalic.  Left lower molar cracked. Tender to percussion. L facial swelling and tenderness. posterior pharynx clear. Handling secretions. Normal sounding phonation. L TM/ext aud canal clear.   Eyes: Conjunctivae are normal. Right  eye exhibits no discharge. Left eye exhibits no discharge.  Neck: Neck supple.  Cardiovascular: Normal rate, regular rhythm and normal heart sounds.  Exam reveals no gallop and no friction rub.   No murmur heard. Pulmonary/Chest: Effort normal and breath sounds normal. No respiratory distress.  Abdominal: Soft. She exhibits no distension. There is no tenderness.  Musculoskeletal: She exhibits no edema and no tenderness.  Neurological: She is alert.  Skin: Skin is warm and dry.  Psychiatric: She has a normal mood and affect. Her behavior is normal. Thought content normal.    ED Course  Procedures (including critical care time) Labs Review Labs Reviewed - No data to display  Imaging Review No results found.   EKG  Interpretation None      MDM   Final diagnoses:  Dental abscess    63 year old female with atraumatic left dental pain/facial swelling. Location is not consistent with parotitis or issue with her submandibular gland. Most likely dental abscess. Cracked molar is most likely culprit. Afebrile. Handling secretions. Will place on antibiotics. Pain medication. Dental followup.    Virgel Manifold, MD 10/15/13 [phone]

## 2014-06-10 ENCOUNTER — Other Ambulatory Visit (HOSPITAL_COMMUNITY): Payer: Self-pay | Admitting: Internal Medicine

## 2014-06-10 DIAGNOSIS — Z1231 Encounter for screening mammogram for malignant neoplasm of breast: Secondary | ICD-10-CM

## 2014-06-13 ENCOUNTER — Ambulatory Visit (HOSPITAL_COMMUNITY): Payer: Medicare HMO

## 2014-07-01 ENCOUNTER — Ambulatory Visit (HOSPITAL_COMMUNITY): Payer: Medicare HMO

## 2014-08-01 ENCOUNTER — Other Ambulatory Visit (HOSPITAL_COMMUNITY): Payer: Self-pay | Admitting: Nephrology

## 2014-08-01 DIAGNOSIS — N183 Chronic kidney disease, stage 3 unspecified: Secondary | ICD-10-CM

## 2014-08-06 ENCOUNTER — Ambulatory Visit (HOSPITAL_COMMUNITY)
Admission: RE | Admit: 2014-08-06 | Discharge: 2014-08-06 | Disposition: A | Discharge status: Home / Self Care | Payer: PPO | Source: Ambulatory Visit | Attending: Nephrology | Admitting: Nephrology

## 2014-08-06 DIAGNOSIS — N183 Chronic kidney disease, stage 3 unspecified: Secondary | ICD-10-CM

## 2014-08-23 ENCOUNTER — Other Ambulatory Visit (HOSPITAL_COMMUNITY): Payer: Self-pay | Admitting: Internal Medicine

## 2014-08-23 DIAGNOSIS — Z1231 Encounter for screening mammogram for malignant neoplasm of breast: Secondary | ICD-10-CM

## 2014-08-30 ENCOUNTER — Ambulatory Visit (HOSPITAL_COMMUNITY)
Admission: RE | Admit: 2014-08-30 | Discharge: 2014-08-30 | Disposition: A | Discharge status: Home / Self Care | Payer: PPO | Source: Ambulatory Visit | Attending: Internal Medicine | Admitting: Internal Medicine

## 2014-08-30 DIAGNOSIS — Z1231 Encounter for screening mammogram for malignant neoplasm of breast: Secondary | ICD-10-CM | POA: Diagnosis not present

## 2015-01-22 ENCOUNTER — Other Ambulatory Visit (HOSPITAL_COMMUNITY): Payer: Self-pay | Admitting: Internal Medicine

## 2015-01-22 DIAGNOSIS — E049 Nontoxic goiter, unspecified: Secondary | ICD-10-CM

## 2015-01-24 ENCOUNTER — Ambulatory Visit (HOSPITAL_COMMUNITY): Payer: PPO

## 2015-01-27 ENCOUNTER — Ambulatory Visit (HOSPITAL_COMMUNITY): Payer: PPO

## 2015-01-30 ENCOUNTER — Ambulatory Visit (HOSPITAL_COMMUNITY): Payer: PPO

## 2015-02-21 ENCOUNTER — Ambulatory Visit (HOSPITAL_COMMUNITY)
Admission: RE | Admit: 2015-02-21 | Discharge: 2015-02-21 | Disposition: A | Discharge status: Home / Self Care | Payer: PPO | Source: Ambulatory Visit | Attending: Internal Medicine | Admitting: Internal Medicine

## 2015-02-21 DIAGNOSIS — E049 Nontoxic goiter, unspecified: Secondary | ICD-10-CM

## 2015-02-21 DIAGNOSIS — E041 Nontoxic single thyroid nodule: Secondary | ICD-10-CM | POA: Diagnosis not present

## 2015-03-27 ENCOUNTER — Ambulatory Visit (INDEPENDENT_AMBULATORY_CARE_PROVIDER_SITE_OTHER): Payer: PPO | Admitting: Otolaryngology

## 2015-03-27 DIAGNOSIS — D440 Neoplasm of uncertain behavior of thyroid gland: Secondary | ICD-10-CM

## 2015-03-27 DIAGNOSIS — K219 Gastro-esophageal reflux disease without esophagitis: Secondary | ICD-10-CM | POA: Diagnosis not present

## 2015-03-27 DIAGNOSIS — R490 Dysphonia: Secondary | ICD-10-CM

## 2015-03-28 ENCOUNTER — Other Ambulatory Visit (INDEPENDENT_AMBULATORY_CARE_PROVIDER_SITE_OTHER): Payer: Self-pay | Admitting: Otolaryngology

## 2015-03-28 DIAGNOSIS — E041 Nontoxic single thyroid nodule: Secondary | ICD-10-CM

## 2015-04-02 ENCOUNTER — Ambulatory Visit (HOSPITAL_COMMUNITY): Admission: RE | Admit: 2015-04-02 | Payer: PPO | Source: Ambulatory Visit

## 2015-04-02 ENCOUNTER — Ambulatory Visit (HOSPITAL_COMMUNITY): Payer: PPO

## 2015-04-16 ENCOUNTER — Ambulatory Visit (HOSPITAL_COMMUNITY): Admission: RE | Admit: 2015-04-16 | Payer: PPO | Source: Ambulatory Visit

## 2015-05-14 ENCOUNTER — Other Ambulatory Visit (INDEPENDENT_AMBULATORY_CARE_PROVIDER_SITE_OTHER): Payer: Self-pay | Admitting: Otolaryngology

## 2015-05-14 ENCOUNTER — Ambulatory Visit (HOSPITAL_COMMUNITY)
Admission: RE | Admit: 2015-05-14 | Discharge: 2015-05-14 | Disposition: A | Discharge status: Home / Self Care | Payer: PPO | Source: Ambulatory Visit | Attending: Otolaryngology | Admitting: Otolaryngology

## 2015-05-14 DIAGNOSIS — E049 Nontoxic goiter, unspecified: Secondary | ICD-10-CM | POA: Diagnosis not present

## 2015-05-14 DIAGNOSIS — E041 Nontoxic single thyroid nodule: Secondary | ICD-10-CM

## 2015-05-14 MED ORDER — LIDOCAINE HCL (PF) 2 % IJ SOLN
INTRAMUSCULAR | Status: AC
  Filled 2015-05-14: qty 10

## 2015-05-14 NOTE — Discharge Instructions (Signed)

## 2015-06-29 ENCOUNTER — Emergency Department (HOSPITAL_COMMUNITY): Payer: PPO

## 2015-06-29 ENCOUNTER — Inpatient Hospital Stay (HOSPITAL_COMMUNITY)
Admission: EM | Admit: 2015-06-29 | Discharge: 2015-07-03 | DRG: 392 | Disposition: A | Discharge status: Home / Self Care | Payer: PPO | Attending: Internal Medicine | Admitting: Internal Medicine

## 2015-06-29 ENCOUNTER — Encounter (HOSPITAL_COMMUNITY): Payer: Self-pay | Admitting: Emergency Medicine

## 2015-06-29 DIAGNOSIS — K5730 Diverticulosis of large intestine without perforation or abscess without bleeding: Secondary | ICD-10-CM | POA: Diagnosis not present

## 2015-06-29 DIAGNOSIS — Z86011 Personal history of benign neoplasm of the brain: Secondary | ICD-10-CM

## 2015-06-29 DIAGNOSIS — K219 Gastro-esophageal reflux disease without esophagitis: Secondary | ICD-10-CM | POA: Diagnosis not present

## 2015-06-29 DIAGNOSIS — R1031 Right lower quadrant pain: Secondary | ICD-10-CM | POA: Diagnosis present

## 2015-06-29 DIAGNOSIS — E039 Hypothyroidism, unspecified: Secondary | ICD-10-CM | POA: Diagnosis not present

## 2015-06-29 DIAGNOSIS — Z87442 Personal history of urinary calculi: Secondary | ICD-10-CM | POA: Diagnosis not present

## 2015-06-29 DIAGNOSIS — K5792 Diverticulitis of intestine, part unspecified, without perforation or abscess without bleeding: Secondary | ICD-10-CM | POA: Diagnosis not present

## 2015-06-29 DIAGNOSIS — R600 Localized edema: Secondary | ICD-10-CM

## 2015-06-29 DIAGNOSIS — Z8249 Family history of ischemic heart disease and other diseases of the circulatory system: Secondary | ICD-10-CM

## 2015-06-29 DIAGNOSIS — E119 Type 2 diabetes mellitus without complications: Secondary | ICD-10-CM | POA: Diagnosis not present

## 2015-06-29 DIAGNOSIS — K5732 Diverticulitis of large intestine without perforation or abscess without bleeding: Principal | ICD-10-CM | POA: Diagnosis present

## 2015-06-29 DIAGNOSIS — M797 Fibromyalgia: Secondary | ICD-10-CM | POA: Diagnosis present

## 2015-06-29 DIAGNOSIS — M1990 Unspecified osteoarthritis, unspecified site: Secondary | ICD-10-CM | POA: Diagnosis present

## 2015-06-29 DIAGNOSIS — R1032 Left lower quadrant pain: Secondary | ICD-10-CM

## 2015-06-29 DIAGNOSIS — Z90710 Acquired absence of both cervix and uterus: Secondary | ICD-10-CM

## 2015-06-29 DIAGNOSIS — Z79891 Long term (current) use of opiate analgesic: Secondary | ICD-10-CM | POA: Diagnosis not present

## 2015-06-29 DIAGNOSIS — R109 Unspecified abdominal pain: Secondary | ICD-10-CM | POA: Diagnosis not present

## 2015-06-29 DIAGNOSIS — Z23 Encounter for immunization: Secondary | ICD-10-CM | POA: Diagnosis not present

## 2015-06-29 DIAGNOSIS — R1030 Lower abdominal pain, unspecified: Secondary | ICD-10-CM | POA: Diagnosis not present

## 2015-06-29 DIAGNOSIS — Z809 Family history of malignant neoplasm, unspecified: Secondary | ICD-10-CM | POA: Diagnosis not present

## 2015-06-29 DIAGNOSIS — I10 Essential (primary) hypertension: Secondary | ICD-10-CM | POA: Diagnosis present

## 2015-06-29 DIAGNOSIS — R188 Other ascites: Secondary | ICD-10-CM | POA: Diagnosis not present

## 2015-06-29 DIAGNOSIS — G8929 Other chronic pain: Secondary | ICD-10-CM | POA: Diagnosis present

## 2015-06-29 LAB — COMPREHENSIVE METABOLIC PANEL
ALBUMIN: 4.1 g/dL (ref 3.5–5.0)
ALK PHOS: 82 U/L (ref 38–126)
ALT: 28 U/L (ref 14–54)
ANION GAP: 10 (ref 5–15)
AST: 23 U/L (ref 15–41)
BUN: 18 mg/dL (ref 6–20)
CHLORIDE: 103 mmol/L (ref 101–111)
CO2: 25 mmol/L (ref 22–32)
Calcium: 9.4 mg/dL (ref 8.9–10.3)
Creatinine, Ser: 0.81 mg/dL (ref 0.44–1.00)
GFR calc non Af Amer: >60 mL/min (ref >60)
GLUCOSE: 223 mg/dL — AB (ref 65–99)
POTASSIUM: 3.6 mmol/L (ref 3.5–5.1)
SODIUM: 138 mmol/L (ref 135–145)
Total Bilirubin: 0.5 mg/dL (ref 0.3–1.2)
Total Protein: 7.5 g/dL (ref 6.5–8.1)

## 2015-06-29 LAB — CBC
HEMATOCRIT: 40.7 % (ref 36.0–46.0)
HEMOGLOBIN: 13.6 g/dL (ref 12.0–15.0)
MCH: 27.2 pg (ref 26.0–34.0)
MCHC: 33.4 g/dL (ref 30.0–36.0)
MCV: 81.4 fL (ref 78.0–100.0)
Platelets: 310 10*3/uL (ref 150–400)
RBC: 5 MIL/uL (ref 3.87–5.11)
RDW: 13.5 % (ref 11.5–15.5)
WBC: 12.9 10*3/uL — ABNORMAL HIGH (ref 4.0–10.5)

## 2015-06-29 LAB — LIPASE, BLOOD: LIPASE: 42 U/L (ref 11–51)

## 2015-06-29 MED ORDER — ACETAMINOPHEN 325 MG PO TABS
650.0000 mg | ORAL_TABLET | Freq: Once | ORAL | Status: AC
  Administered 2015-06-29: 650 mg via ORAL
  Filled 2015-06-29: qty 2

## 2015-06-29 MED ORDER — ONDANSETRON HCL 4 MG/2ML IJ SOLN: 4.0000 mg | Freq: Once | INTRAMUSCULAR | Status: DC | PRN

## 2015-06-29 NOTE — ED Notes (Signed)
Abdominal pain, lower, denies, vomiting or diarrhea, fever

## 2015-06-29 NOTE — ED Provider Notes (Signed)
CSN: [id number]     Arrival date & time 06/29/15  2127 History   First MD Initiated Contact with Patient 06/29/15 2258     Chief Complaint  Patient presents with  . Abdominal Pain     (Consider location/radiation/quality/duration/timing/severity/associated sxs/prior Treatment) Patient is a 65 y.o. female presenting with abdominal pain. The history is provided by the patient.  Abdominal Pain Pain location:  Suprapubic Pain quality: sharp   Pain quality comment:  Spasm type pain Pain severity:  Moderate Onset quality:  Gradual Duration:  1 day Timing:  Intermittent Progression:  Worsening Chronicity:  New Context: not diet changes, not recent travel and not suspicious food intake   Relieved by:  Nothing Worsened by:  Nothing tried Ineffective treatments:  Position changes and lying down Associated symptoms: no belching, no constipation, no diarrhea, no dysuria, no hematemesis, no hematochezia, no hematuria, no melena, no nausea, no shortness of breath and no vaginal discharge   Associated symptoms comment:  Bodyaches Risk factors: no alcohol abuse and no NSAID use     Past Medical History  Diagnosis Date  . Hypertension   . Hypothyroidism   . GERD (gastroesophageal reflux disease)   . History of kidney stones   . Arthritis   . Headache(784.0)     sinus headaches-seasonal  . Onychomycosis     LEFT BIG TOE  . Neuromuscular disorder (HCC)     fibromyalgia  . Dysphagia   . Palpitations   . Dysrhythmia     BENIGN PVCS  . H/O hiatal hernia   . Fibromyalgia   . Degenerative joint disease of spine     SEVERE LOW BACK PAIN-DDD, SOMETIMES PAIN IN LOWER LEGS;  PT ALSO HAS DDD CERVICAL - WITH NECK PAIN THAT RADIATES TO BOTH SHOULDERS AND ARMS AND NUMBNESS FINGERS BOTH HANDS.  AS OF 09/18/13 - PT HAS NOTICED WORSENING OF NECK, SHOULDER PAIN - "SEARING PAIN" & FEELING OF ELECTRICAL SENSATIONS - SHE PLANS TO GET IN TOUCH WITH HER NEUROLOGIST THIS WEEK BEFORE PLANNED NISSEN SURG ON 3/30   . Hoarseness of voice     PT RELATES TO GERD PROBLEM   Past Surgical History  Procedure Laterality Date  . Cholecystectomy    . Periosteal chondroma  1979    left middle finger-benign  . Abdominal hysterectomy  1990    partial  . Dilation and curettage of uterus    . Brain surgery  1995    meningioma-benign  . Parathyroid exploration  2011    tumor off-benign  . Esophagogastroduodenoscopy (egd) with propofol N/A 05/09/2013    Procedure: ESOPHAGOGASTRODUODENOSCOPY (EGD) WITH PROPOFOL;  Surgeon: Arta Silence, MD;  Location: [facility];  Service: Endoscopy;  Laterality: N/A;  . Bravo ph study N/A 05/09/2013    Procedure: BRAVO [place];  Surgeon: Arta Silence, MD;  Location: [facility];  Service: Endoscopy;  Laterality: N/A;  . Colonoscopy  06/08/06  . Wisdom tooth extraction    . Laparoscopic nissen fundoplication N/A [id number]    Procedure: LAPAROSCOPIC NISSEN AND HIATAL HERNIA REPAIR;  Surgeon: Pedro Earls, MD;  Location: [facility];  Service: General;  Laterality: N/A;   Family History  Problem Relation Age of Onset  . Cancer Mother     colon cancer?  . Heart disease Father    Social History  Substance Use Topics  . Smoking status: Never Smoker   . Smokeless tobacco: Never Used  . Alcohol Use: No   OB History    No data available  Review of Systems  Respiratory: Negative for shortness of breath.   Gastrointestinal: Positive for abdominal pain. Negative for nausea, diarrhea, constipation, melena, hematochezia and hematemesis.  Genitourinary: Negative for dysuria, hematuria, vaginal discharge and difficulty urinating.  All other systems reviewed and are negative.     Allergies  Erythromycin; Morphine and related; and Nsaids  Home Medications   Prior to Admission medications   Medication Sig Start Date End Date Taking? Authorizing Provider  Biotin 5000 MCG TABS Take 1 tablet by mouth daily.   Yes Historical Provider, MD  diclofenac sodium  (VOLTAREN) 1 % GEL Apply 2 g topically 4 (four) times daily as needed (Pain).   Yes Historical Provider, MD  fenofibrate 160 MG tablet Take 160 mg by mouth at bedtime.   Yes Historical Provider, MD  hydrochlorothiazide (HYDRODIURIL) 25 MG tablet Take 25 mg by mouth daily as needed (Blood Pressure).    Yes Historical Provider, MD  levothyroxine (SYNTHROID, LEVOTHROID) 100 MCG tablet Take 100 mcg by mouth daily before breakfast.   Yes Historical Provider, MD  lisinopril (PRINIVIL,ZESTRIL) 10 MG tablet Take 10 mg by mouth every morning.   Yes Historical Provider, MD  loratadine (CLARITIN) 10 MG tablet Take 10 mg by mouth every morning.   Yes Historical Provider, MD  methocarbamol (ROBAXIN) 500 MG tablet Take 500 mg by mouth every 6 (six) hours as needed for muscle spasms.   Yes Historical Provider, MD  metoprolol succinate (TOPROL-XL) 50 MG 24 hr tablet Take 50 mg by mouth every morning. Take with or immediately following a meal.   Yes Historical Provider, MD  oxyCODONE-acetaminophen (PERCOCET) 7.5-325 MG tablet Take 1 tablet by mouth every 6 (six) hours as needed for moderate pain.   Yes Historical Provider, MD  zolpidem (AMBIEN) 10 MG tablet Take 3.33 mg by mouth at bedtime as needed for sleep (Patient usually cuts it into a third).   Yes Historical Provider, MD  cyclobenzaprine (FLEXERIL) 10 MG tablet Take 10 mg by mouth 3 (three) times daily.    Historical Provider, MD  HYDROcodone-acetaminophen (NORCO/VICODIN) 5-325 MG per tablet Take 1-2 tablets by mouth every 4 (four) hours as needed. 10/07/13   Virgel Manifold, MD   BP 152/73 mmHg  Pulse 94  Temp(Src) 100.7 F (38.2 C) (Temporal)  Resp 18  Ht 5' (1.524 m)  Wt 76.204 kg  BMI 32.81 kg/m2  SpO2 98% Physical Exam  Constitutional: She is oriented to person, place, and time. She appears well-developed and well-nourished.  Non-toxic appearance.  HENT:  Head: Normocephalic.  Right Ear: Tympanic membrane and external ear normal.  Left Ear:  Tympanic membrane and external ear normal.  Eyes: EOM and lids are normal. Pupils are equal, round, and reactive to light.  Neck: Normal range of motion. Neck supple. Carotid bruit is not present.  Cardiovascular: Normal rate, regular rhythm, normal heart sounds, intact distal pulses and normal pulses.   Pulmonary/Chest: Breath sounds normal. No respiratory distress.  Abdominal: Soft. Bowel sounds are normal. There is no hepatosplenomegaly. There is tenderness in the right lower quadrant and left lower quadrant. There is no guarding and no CVA tenderness.  Discomfort with flex of psoas, but not severe pain.  Musculoskeletal: Normal range of motion.  Lymphadenopathy:       Head (right side): No submandibular adenopathy present.       Head (left side): No submandibular adenopathy present.    She has no cervical adenopathy.  Neurological: She is alert and oriented to person, place, and time.  She has normal strength. No cranial nerve deficit or sensory deficit.  Skin: Skin is warm and dry.  Psychiatric: She has a normal mood and affect. Her speech is normal.  Nursing note and vitals reviewed.   ED Course  Pt has a local reaction to the IV cipro and flagyl. This was stopped. IV Unasyn started.  Procedures (including critical care time) Labs Review Labs Reviewed  LIPASE, BLOOD  COMPREHENSIVE METABOLIC PANEL  CBC  URINALYSIS, ROUTINE W REFLEX MICROSCOPIC (NOT AT [facility])    Imaging Review No results found. I have personally reviewed and evaluated these images and lab results as part of my medical decision-making.   EKG Interpretation None      MDM  Vital signs reviewed.  UA reaveals small Leuk, and 6-30 WBC. Cmet reveals glucose of 233, o/w wnl. CBC - WBC 12.9 elevated, No shift to the left. CT suggest diverticulitis with question of micro perf. Case discussed with Hospitalist. Pt to be admitted to observation.    Final diagnoses:  Diverticulitis of colon    **I have reviewed  nursing notes, vital signs, and all appropriate lab and imaging results for this patient.Lily Kocher, PA-C 06/30/15 [id number]  Daleen Bo, MD 06/30/15 [phone]

## 2015-06-30 ENCOUNTER — Encounter (HOSPITAL_COMMUNITY): Payer: Self-pay | Admitting: *Deleted

## 2015-06-30 DIAGNOSIS — Z8249 Family history of ischemic heart disease and other diseases of the circulatory system: Secondary | ICD-10-CM | POA: Diagnosis not present

## 2015-06-30 DIAGNOSIS — R188 Other ascites: Secondary | ICD-10-CM | POA: Diagnosis not present

## 2015-06-30 DIAGNOSIS — R1031 Right lower quadrant pain: Secondary | ICD-10-CM | POA: Diagnosis present

## 2015-06-30 DIAGNOSIS — I10 Essential (primary) hypertension: Secondary | ICD-10-CM | POA: Diagnosis not present

## 2015-06-30 DIAGNOSIS — Z86011 Personal history of benign neoplasm of the brain: Secondary | ICD-10-CM | POA: Diagnosis not present

## 2015-06-30 DIAGNOSIS — Z87442 Personal history of urinary calculi: Secondary | ICD-10-CM | POA: Diagnosis not present

## 2015-06-30 DIAGNOSIS — K5792 Diverticulitis of intestine, part unspecified, without perforation or abscess without bleeding: Secondary | ICD-10-CM | POA: Diagnosis present

## 2015-06-30 DIAGNOSIS — E039 Hypothyroidism, unspecified: Secondary | ICD-10-CM | POA: Diagnosis not present

## 2015-06-30 DIAGNOSIS — K219 Gastro-esophageal reflux disease without esophagitis: Secondary | ICD-10-CM | POA: Diagnosis not present

## 2015-06-30 DIAGNOSIS — M797 Fibromyalgia: Secondary | ICD-10-CM | POA: Diagnosis not present

## 2015-06-30 DIAGNOSIS — R1032 Left lower quadrant pain: Secondary | ICD-10-CM

## 2015-06-30 DIAGNOSIS — K5732 Diverticulitis of large intestine without perforation or abscess without bleeding: Secondary | ICD-10-CM | POA: Diagnosis not present

## 2015-06-30 DIAGNOSIS — M1990 Unspecified osteoarthritis, unspecified site: Secondary | ICD-10-CM | POA: Diagnosis not present

## 2015-06-30 DIAGNOSIS — Z809 Family history of malignant neoplasm, unspecified: Secondary | ICD-10-CM | POA: Diagnosis not present

## 2015-06-30 DIAGNOSIS — Z79891 Long term (current) use of opiate analgesic: Secondary | ICD-10-CM | POA: Diagnosis not present

## 2015-06-30 DIAGNOSIS — G8929 Other chronic pain: Secondary | ICD-10-CM | POA: Diagnosis not present

## 2015-06-30 DIAGNOSIS — E119 Type 2 diabetes mellitus without complications: Secondary | ICD-10-CM | POA: Diagnosis not present

## 2015-06-30 DIAGNOSIS — K5730 Diverticulosis of large intestine without perforation or abscess without bleeding: Secondary | ICD-10-CM | POA: Diagnosis not present

## 2015-06-30 DIAGNOSIS — R109 Unspecified abdominal pain: Secondary | ICD-10-CM | POA: Diagnosis not present

## 2015-06-30 DIAGNOSIS — Z23 Encounter for immunization: Secondary | ICD-10-CM | POA: Diagnosis not present

## 2015-06-30 DIAGNOSIS — Z90710 Acquired absence of both cervix and uterus: Secondary | ICD-10-CM | POA: Diagnosis not present

## 2015-06-30 DIAGNOSIS — R1030 Lower abdominal pain, unspecified: Secondary | ICD-10-CM | POA: Diagnosis not present

## 2015-06-30 LAB — GLUCOSE, CAPILLARY: GLUCOSE-CAPILLARY: 97 mg/dL (ref 65–99)

## 2015-06-30 LAB — URINE MICROSCOPIC-ADD ON

## 2015-06-30 LAB — CBC
HCT: 39.1 % (ref 36.0–46.0)
HEMOGLOBIN: 13 g/dL (ref 12.0–15.0)
MCH: 27.3 pg (ref 26.0–34.0)
MCHC: 33.2 g/dL (ref 30.0–36.0)
MCV: 82.1 fL (ref 78.0–100.0)
Platelets: 298 10*3/uL (ref 150–400)
RBC: 4.76 MIL/uL (ref 3.87–5.11)
RDW: 13.6 % (ref 11.5–15.5)
WBC: 14.1 10*3/uL — ABNORMAL HIGH (ref 4.0–10.5)

## 2015-06-30 LAB — BASIC METABOLIC PANEL
ANION GAP: 7 (ref 5–15)
BUN: 15 mg/dL (ref 6–20)
CALCIUM: 9 mg/dL (ref 8.9–10.3)
CHLORIDE: 108 mmol/L (ref 101–111)
CO2: 27 mmol/L (ref 22–32)
CREATININE: 0.82 mg/dL (ref 0.44–1.00)
GFR calc non Af Amer: >60 mL/min (ref >60)
GLUCOSE: 112 mg/dL — AB (ref 65–99)
Potassium: 3.7 mmol/L (ref 3.5–5.1)
Sodium: 142 mmol/L (ref 135–145)

## 2015-06-30 LAB — URINALYSIS, ROUTINE W REFLEX MICROSCOPIC
BILIRUBIN URINE: NEGATIVE
Glucose, UA: 250 mg/dL — AB
HGB URINE DIPSTICK: NEGATIVE
Ketones, ur: NEGATIVE mg/dL
Nitrite: NEGATIVE
PH: 7 (ref 5.0–8.0)
Protein, ur: NEGATIVE mg/dL
SPECIFIC GRAVITY, URINE: 1.015 (ref 1.005–1.030)

## 2015-06-30 MED ORDER — SODIUM CHLORIDE 0.9 % IV SOLN
INTRAVENOUS | Status: DC
  Administered 2015-06-30 – 2015-07-01 (×3): via INTRAVENOUS

## 2015-06-30 MED ORDER — METRONIDAZOLE IN NACL 5-0.79 MG/ML-% IV SOLN: 500.0000 mg | Freq: Three times a day (TID) | INTRAVENOUS | Status: DC

## 2015-06-30 MED ORDER — ACETAMINOPHEN 325 MG PO TABS
650.0000 mg | ORAL_TABLET | Freq: Four times a day (QID) | ORAL | Status: DC | PRN
  Administered 2015-06-30 – 2015-07-01 (×3): 650 mg via ORAL
  Filled 2015-06-30 (×3): qty 2

## 2015-06-30 MED ORDER — HYDROCODONE-ACETAMINOPHEN 5-325 MG PO TABS
2.0000 | ORAL_TABLET | Freq: Once | ORAL | Status: AC
  Administered 2015-06-30: 2 via ORAL
  Filled 2015-06-30: qty 2

## 2015-06-30 MED ORDER — OXYCODONE-ACETAMINOPHEN 7.5-325 MG PO TABS
1.0000 | ORAL_TABLET | Freq: Three times a day (TID) | ORAL | Status: DC | PRN
  Administered 2015-06-30 – 2015-07-03 (×7): 1 via ORAL
  Filled 2015-06-30 (×7): qty 1

## 2015-06-30 MED ORDER — ONDANSETRON HCL 4 MG PO TABS: 4.0000 mg | ORAL_TABLET | Freq: Four times a day (QID) | ORAL | Status: DC | PRN

## 2015-06-30 MED ORDER — CIPROFLOXACIN IN D5W 400 MG/200ML IV SOLN
400.0000 mg | Freq: Once | INTRAVENOUS | Status: AC
  Administered 2015-06-30: 400 mg via INTRAVENOUS
  Filled 2015-06-30: qty 200

## 2015-06-30 MED ORDER — HYDROMORPHONE HCL 1 MG/ML IJ SOLN
0.5000 mg | INTRAMUSCULAR | Status: DC | PRN
  Administered 2015-06-30 – 2015-07-01 (×6): 0.5 mg via INTRAVENOUS
  Filled 2015-06-30 (×6): qty 1

## 2015-06-30 MED ORDER — MORPHINE SULFATE (PF) 2 MG/ML IV SOLN: 2.0000 mg | INTRAVENOUS | Status: DC | PRN

## 2015-06-30 MED ORDER — METOPROLOL SUCCINATE ER 50 MG PO TB24
50.0000 mg | ORAL_TABLET | Freq: Every morning | ORAL | Status: DC
  Administered 2015-06-30 – 2015-07-03 (×4): 50 mg via ORAL
  Filled 2015-06-30 (×4): qty 1

## 2015-06-30 MED ORDER — LISINOPRIL 10 MG PO TABS
10.0000 mg | ORAL_TABLET | Freq: Every morning | ORAL | Status: DC
  Administered 2015-06-30 – 2015-07-03 (×4): 10 mg via ORAL
  Filled 2015-06-30 (×4): qty 1

## 2015-06-30 MED ORDER — ACETAMINOPHEN 650 MG RE SUPP: 650.0000 mg | Freq: Four times a day (QID) | RECTAL | Status: DC | PRN

## 2015-06-30 MED ORDER — INSULIN ASPART 100 UNIT/ML ~~LOC~~ SOLN: 0.0000 [IU] | Freq: Three times a day (TID) | SUBCUTANEOUS | Status: DC

## 2015-06-30 MED ORDER — ONDANSETRON HCL 4 MG/2ML IJ SOLN
4.0000 mg | Freq: Once | INTRAMUSCULAR | Status: AC
  Administered 2015-06-30: 4 mg via INTRAMUSCULAR
  Filled 2015-06-30: qty 2

## 2015-06-30 MED ORDER — LEVOTHYROXINE SODIUM 100 MCG PO TABS
100.0000 ug | ORAL_TABLET | Freq: Every day | ORAL | Status: DC
  Administered 2015-06-30 – 2015-07-03 (×4): 100 ug via ORAL
  Filled 2015-06-30 (×4): qty 1

## 2015-06-30 MED ORDER — METRONIDAZOLE IN NACL 5-0.79 MG/ML-% IV SOLN
500.0000 mg | Freq: Once | INTRAVENOUS | Status: AC
  Administered 2015-06-30: 500 mg via INTRAVENOUS
  Filled 2015-06-30: qty 100

## 2015-06-30 MED ORDER — METHOCARBAMOL 500 MG PO TABS
500.0000 mg | ORAL_TABLET | Freq: Four times a day (QID) | ORAL | Status: DC | PRN
  Administered 2015-06-30 – 2015-07-03 (×6): 500 mg via ORAL
  Filled 2015-06-30 (×6): qty 1

## 2015-06-30 MED ORDER — DIPHENHYDRAMINE HCL 50 MG/ML IJ SOLN
25.0000 mg | Freq: Once | INTRAMUSCULAR | Status: AC
  Administered 2015-06-30: 25 mg via INTRAVENOUS
  Filled 2015-06-30: qty 1

## 2015-06-30 MED ORDER — SODIUM CHLORIDE 0.9 % IV SOLN
3.0000 g | Freq: Four times a day (QID) | INTRAVENOUS | Status: DC
  Administered 2015-06-30: 3 g via INTRAVENOUS
  Filled 2015-06-30 (×5): qty 3

## 2015-06-30 MED ORDER — SODIUM CHLORIDE 0.9 % IV SOLN
3.0000 g | Freq: Once | INTRAVENOUS | Status: AC
  Administered 2015-06-30: 3 g via INTRAVENOUS
  Filled 2015-06-30: qty 3

## 2015-06-30 MED ORDER — PIPERACILLIN-TAZOBACTAM 3.375 G IVPB
3.3750 g | Freq: Three times a day (TID) | INTRAVENOUS | Status: DC
  Administered 2015-06-30 – 2015-07-02 (×7): 3.375 g via INTRAVENOUS
  Filled 2015-06-30 (×7): qty 50

## 2015-06-30 MED ORDER — ONDANSETRON HCL 4 MG/2ML IJ SOLN: 4.0000 mg | Freq: Four times a day (QID) | INTRAMUSCULAR | Status: DC | PRN

## 2015-06-30 MED ORDER — SODIUM CHLORIDE 0.9 % IV SOLN
INTRAVENOUS | Status: DC
  Administered 2015-06-30: 04:00:00 via INTRAVENOUS

## 2015-06-30 MED ORDER — CIPROFLOXACIN IN D5W 400 MG/200ML IV SOLN: 400.0000 mg | Freq: Two times a day (BID) | INTRAVENOUS | Status: DC

## 2015-06-30 NOTE — Progress Notes (Addendum)
ANTIBIOTIC CONSULT NOTE - INITIAL(updated  Pharmacy Consult for Zosyn Indication: Intra-abdominal infection  Allergies  Allergen Reactions  . Erythromycin Other (See Comments)    Upsets stomach  . Morphine And Related Other (See Comments)    HALLUCINATIONS  . Nsaids Other (See Comments)    Kidney Failure  . Ciprofloxacin Rash  . Flagyl [Metronidazole] Rash    Patient Measurements: Height: 5' (152.4 cm) Weight: 175 lb 12.8 oz (79.742 kg) IBW/kg (Calculated) : 45.5  Vital Signs: Temp: 99.5 F (37.5 C) (01/02 0637) Temp Source: Oral (01/02 0637) BP: 131/71 mmHg (01/02 0637) Pulse Rate: 80 (01/02 0637) Intake/Output from previous day:   Intake/Output from this shift:    Labs:  Recent Labs  06/29/15 2250 06/30/15 0459  WBC 12.9* 14.1*  HGB 13.6 13.0  PLT 310 298  CREATININE 0.81 0.82   Estimated Creatinine Clearance: 65.6 mL/min (by C-G formula based on Cr of 0.82). No results for input(s): VANCOTROUGH, VANCOPEAK, VANCORANDOM, GENTTROUGH, GENTPEAK, GENTRANDOM, TOBRATROUGH, TOBRAPEAK, TOBRARND, AMIKACINPEAK, AMIKACINTROU, AMIKACIN in the last 72 hours.   Microbiology: No results found for this or any previous visit (from the past 720 hour(s)).  Medical History: Past Medical History  Diagnosis Date  . Hypertension   . Hypothyroidism   . GERD (gastroesophageal reflux disease)   . History of kidney stones   . Arthritis   . Headache(784.0)     sinus headaches-seasonal  . Onychomycosis     LEFT BIG TOE  . Neuromuscular disorder (HCC)     fibromyalgia  . Dysphagia   . Palpitations   . Dysrhythmia     BENIGN PVCS  . H/O hiatal hernia   . Fibromyalgia   . Degenerative joint disease of spine     SEVERE LOW BACK PAIN-DDD, SOMETIMES PAIN IN LOWER LEGS;  PT ALSO HAS DDD CERVICAL - WITH NECK PAIN THAT RADIATES TO BOTH SHOULDERS AND ARMS AND NUMBNESS FINGERS BOTH HANDS.  AS OF 09/18/13 - PT HAS NOTICED WORSENING OF NECK, SHOULDER PAIN - "SEARING PAIN" & FEELING OF  ELECTRICAL SENSATIONS - SHE PLANS TO GET IN TOUCH WITH HER NEUROLOGIST THIS WEEK BEFORE PLANNED NISSEN SURG ON 3/30  . Hoarseness of voice     PT RELATES TO GERD PROBLEM    Medications:  Prescriptions prior to admission  Medication Sig Dispense Refill Last Dose  . Biotin 5000 MCG TABS Take 1 tablet by mouth daily.   06/29/2015  . diclofenac sodium (VOLTAREN) 1 % GEL Apply 2 g topically 4 (four) times daily as needed (Pain).   Past Week  . fenofibrate 160 MG tablet Take 160 mg by mouth at bedtime.   06/28/2015  . hydrochlorothiazide (HYDRODIURIL) 25 MG tablet Take 25 mg by mouth daily as needed (Blood Pressure).    06/27/2015  . levothyroxine (SYNTHROID, LEVOTHROID) 100 MCG tablet Take 100 mcg by mouth daily before breakfast.   06/29/2015  . lisinopril (PRINIVIL,ZESTRIL) 10 MG tablet Take 10 mg by mouth every morning.   06/29/2015  . loratadine (CLARITIN) 10 MG tablet Take 10 mg by mouth every morning.   06/29/2015  . methocarbamol (ROBAXIN) 500 MG tablet Take 500 mg by mouth every 6 (six) hours as needed for muscle spasms.   06/29/2015  . metoprolol succinate (TOPROL-XL) 50 MG 24 hr tablet Take 50 mg by mouth every morning. Take with or immediately following a meal.   06/29/2015 at 0730  . oxyCODONE-acetaminophen (PERCOCET) 7.5-325 MG tablet Take 1 tablet by mouth every 6 (six) hours as needed for  moderate pain.   06/29/2015  . zolpidem (AMBIEN) 10 MG tablet Take 3.33 mg by mouth at bedtime as needed for sleep (Patient usually cuts it into a third).   06/26/2015  . cyclobenzaprine (FLEXERIL) 10 MG tablet Take 10 mg by mouth 3 (three) times daily.   10/07/2013 at Unknown time  . HYDROcodone-acetaminophen (NORCO/VICODIN) 5-325 MG per tablet Take 1-2 tablets by mouth every 4 (four) hours as needed. 20 tablet 0    Assessment: 65 yo female presents to ED with abdominal pain and fever. . CT shows acute diverticulitis with microperforation. Initially given Cipro/Flagyl; however, she broke out in a rash. Most likely  d/t cipro. Empiric tx with Unasyn changed to Zosyn  Goal of Therapy:  Resolution of infection  Plan:  Zosyn 3.375gm IV q8h extended dosing interval  Follow up culture results  Monitor V/S and labs  Isac Sarna, BS Vena [place], BCPS Clinical Pharmacist Pager [phone] 06/30/2015,8:50 AM

## 2015-06-30 NOTE — Progress Notes (Signed)
[facility] PROGRESS NOTE  Kim Warren [id number] DOB: 1951-05-05 DOA: 06/29/2015 PCP: [name], MD  Assessment/Plan: 65 y/o female with PMH of HTN, Hypothyroidism, Chronic Pains, Fibromyalgia, h/o Diverticulitis presented with abdominal pains and fever  -admitted with acute diverticulitis   1. Acute diverticulitis. CT abd: Acute sigmoid colon diverticulitis, with considerable surrounding inflammatory stranding, and possible localize contain micro perforation, but no discrete fluid-filled abscess or true free intraperitoneal gas. No pneumatosis. Pt had some rash with cipro/flagyl in ED then changed to Unasyn on admission.  -we will start IV zosyn-> broaden GI coverage. Cont antiemetics, pain control. D/w patient, will need eventually colonoscopy in 4-6 weeks   2. CT showed Abnormal hypodensity within the right gluteus medius, probably a fluid collection in the setting of bursitis, although not entirely specific. This could be further investigated in the nonacute setting by ultrasound or MRI if clinically warranted 3. HTN.stable. Cont home regimen  4. Chronic Pains, Fibromyalgia. Cont pain control with acute pain, diverticulitis. avoid overdose     Code Status: full Family Communication: d/w patient (indicate person spoken with, relationship, and if by phone, the number) Disposition Plan: home 2-3 days     Consultants:  none  Procedures:  none  Antibiotics:  Cipro/flagyl 06/30/15, Unasyn one dose 06/30/15  Zosyn 1/2>>>> (indicate start date, and stop date if known)  HPI/Subjective: Alert. No distress   Objective: Filed Vitals:   06/30/15 0637 06/30/15 1449  BP: 131/71 134/68  Pulse: 80 83  Temp: 99.5 F (37.5 C) 98.7 F (37.1 C)  Resp: 20 18    Intake/Output Summary (Last 24 hours) at 06/30/15 1517 Last data filed at 06/30/15 0800  Gross per 24 hour  Intake      0 ml  Output      0 ml  Net      0 ml   Filed Weights   06/29/15 2154 06/30/15 0637   Weight: 76.204 kg (168 lb) 79.742 kg (175 lb 12.8 oz)    Exam:   General:  Alert, oriented   Cardiovascular: s1, s2 rrr  Respiratory: CTA BL   Abdomen: soft, mild LLQ pain. No rebound   Musculoskeletal: no leg edem a   Data Reviewed: Basic Metabolic Panel:  Recent Labs Lab 06/29/15 2250 06/30/15 0459  NA 138 142  K 3.6 3.7  CL 103 108  CO2 25 27  GLUCOSE 223* 112*  BUN 18 15  CREATININE 0.81 0.82  CALCIUM 9.4 9.0   Liver Function Tests:  Recent Labs Lab 06/29/15 2250  AST 23  ALT 28  ALKPHOS 82  BILITOT 0.5  PROT 7.5  ALBUMIN 4.1    Recent Labs Lab 06/29/15 2250  LIPASE 42   No results for input(s): AMMONIA in the last 168 hours. CBC:  Recent Labs Lab 06/29/15 2250 06/30/15 0459  WBC 12.9* 14.1*  HGB 13.6 13.0  HCT 40.7 39.1  MCV 81.4 82.1  PLT 310 298   Cardiac Enzymes: No results for input(s): CKTOTAL, CKMB, CKMBINDEX, TROPONINI in the last 168 hours. BNP (last 3 results) No results for input(s): BNP in the last 8760 hours.  ProBNP (last 3 results) No results for input(s): PROBNP in the last 8760 hours.  CBG:  Recent Labs Lab 06/30/15 0318  GLUCAP 97    No results found for this or any previous visit (from the past 240 hour(s)).   Studies: Ct Renal Stone Study  06/30/2015  CLINICAL DATA:  Lower abdominal pain. Right greater than left flank  pain. History of renal calculi. EXAM: CT ABDOMEN AND PELVIS WITHOUT CONTRAST TECHNIQUE: Multidetector CT imaging of the abdomen and pelvis was performed following the standard protocol without IV contrast. COMPARISON:  08/06/2014 FINDINGS: Lower chest: Dependent subsegmental atelectasis in the posterior basal segment right lower lobe. Hepatobiliary: Cholecystectomy. Pancreas: Unremarkable Spleen: Unremarkable Adrenals/Urinary Tract: Adrenal glands and kidneys normal. There is potentially secondary inflammation of the right ureter due to proximity to the sigmoid diverticulitis. Stomach/Bowel:  Gastric fundoplication. Speckled density in the colon, probably barium more dietary. Prominently inflamed sigmoid colon diverticula along the dorsal margin of the sigmoid colon on image 55 series 2 and image 52 series 5, tiny locular adjacent gas such that contained local micro perforation is not excluded. Extensive surrounding stranding. No discrete abscess. There is also sigmoid diverticulosis and scattered diverticula in the rest of the colon, particularly the descending colon. Appendix poorly seen, possibly partially obscured by the inflammatory stranding, but not thought to be the epicenter of the inflammatory process. Vascular/Lymphatic: Mild aortoiliac atherosclerosis. Reproductive: Uterus absent. Ovaries unremarkable wall of the right ovary is partially obscured by the inflammatory stranding from these diverticulitis. Other: No supplemental non-categorized findings. Musculoskeletal: Abnormal 4.2 by 2.7 by 3.0 cm hypodensity within or along the gluteus minimus muscle on the right, with some muscular expansion as shown on image 65 series 2. Exaggerated lumbar lordosis with 3 mm anterolisthesis at L3-4 and 6 mm anterolisthesis at L4-5, with degenerative facet arthropathy but no pars defects. Multilevel mild to moderate foraminal impingement in the lumbar spine. Lower thoracic spondylosis. IMPRESSION: 1. Acute sigmoid colon diverticulitis, with considerable surrounding inflammatory stranding, and possible localize contain micro perforation, but no discrete fluid-filled abscess or true free intraperitoneal gas. No pneumatosis. There is also descending and sigmoid colon diverticulosis. The dominant Li inflamed diverticulum appears to have some internal high density material, probably left over barium from a prior barium procedure. 2. Abnormal hypodensity within the right gluteus medius, probably a fluid collection in the setting of bursitis, although not entirely specific. This could be further investigated in the  nonacute setting by ultrasound or MRI if clinically warranted. 3. Mild atherosclerosis. 4. Exaggerated lumbar lordosis with multilevel considerable degenerative facet arthropathy, and degenerative subluxations. Multilevel mild to moderate foraminal impingement in the lumbar spine. Electronically Signed   By: Van Clines M.D.   On: 06/30/2015 00:10    Scheduled Meds: . insulin aspart  0-9 Units Subcutaneous TID WC  . levothyroxine  100 mcg Oral QAC breakfast  . lisinopril  10 mg Oral q morning - 10a  . metoprolol succinate  50 mg Oral q morning - 10a  . piperacillin-tazobactam (ZOSYN)  IV  3.375 g Intravenous 3 times per day   Continuous Infusions: . sodium chloride 75 mL/hr at 06/30/15 0346    Principal Problem:   Acute diverticulitis of intestine Active Problems:   GERD (gastroesophageal reflux disease)   Hypertension   Abdominal pain, acute, bilateral lower quadrant   History of kidney stones   Diverticulitis of colon    Time spent: >35 minutes     Kinnie Feil  [facility] Pager [phone]. If 7PM-7AM, please contact night-coverage at [email or web address], password [facility] 06/30/2015, 3:17 PM

## 2015-06-30 NOTE — H&P (Signed)
PCP:   [name], MD   Chief Complaint:  abd pain  HPI: 65 yo female h/o htn, fibromyalgia, comes in with one day of bilateral lower abdominal pain right more than left with associated fever.  This am it started low grade then spiked later in the evening to 101 with progressive worsening of her abdominal pain.  She thought maybe it was another kidney stone but this felt much different than her previous kidney stones.  Denies any n/v/d.  Ct shows acute diverticulitis with microperf.   Pt has one other episode of diverticulitis many years ago.  Referred for admission for her infection.  Of note, pt was getting cipro and flagyl at the same time in her iv in the ED per report by floor nursing staff and broke out in rash, allergic reaction abx were stopped and she was given unasyn instead.  Pt has taken both cipro and flagyl in past without problems.  Review of Systems:  Positive and negative as per HPI otherwise all other systems are negative  Past Medical History: Past Medical History  Diagnosis Date  . Hypertension   . Hypothyroidism   . GERD (gastroesophageal reflux disease)   . History of kidney stones   . Arthritis   . Headache(784.0)     sinus headaches-seasonal  . Onychomycosis     LEFT BIG TOE  . Neuromuscular disorder (HCC)     fibromyalgia  . Dysphagia   . Palpitations   . Dysrhythmia     BENIGN PVCS  . H/O hiatal hernia   . Fibromyalgia   . Degenerative joint disease of spine     SEVERE LOW BACK PAIN-DDD, SOMETIMES PAIN IN LOWER LEGS;  PT ALSO HAS DDD CERVICAL - WITH NECK PAIN THAT RADIATES TO BOTH SHOULDERS AND ARMS AND NUMBNESS FINGERS BOTH HANDS.  AS OF 09/18/13 - PT HAS NOTICED WORSENING OF NECK, SHOULDER PAIN - "SEARING PAIN" & FEELING OF ELECTRICAL SENSATIONS - SHE PLANS TO GET IN TOUCH WITH HER NEUROLOGIST THIS WEEK BEFORE PLANNED NISSEN SURG ON 3/30  . Hoarseness of voice     PT RELATES TO GERD PROBLEM   Past Surgical History  Procedure Laterality Date   . Cholecystectomy    . Periosteal chondroma  1979    left middle finger-benign  . Abdominal hysterectomy  1990    partial  . Dilation and curettage of uterus    . Brain surgery  1995    meningioma-benign  . Parathyroid exploration  2011    tumor off-benign  . Esophagogastroduodenoscopy (egd) with propofol N/A 05/09/2013    Procedure: ESOPHAGOGASTRODUODENOSCOPY (EGD) WITH PROPOFOL;  Surgeon: Arta Silence, MD;  Location: [facility];  Service: Endoscopy;  Laterality: N/A;  . Bravo ph study N/A 05/09/2013    Procedure: BRAVO [place];  Surgeon: Arta Silence, MD;  Location: [facility] ENDOS[facility];  Service: Endoscopy;  Laterality: N/A;  . Colonoscopy  06/08/06  . Wisdom tooth extraction    . Laparoscopic nissen fundoplication N/A [id number]    Procedure: LAPAROSCOPIC NISSEN AND HIATAL HERNIA REPAIR;  Surgeon: Pedro Earls, MD;  Location: [facility] ORS;  Service: General;  Laterality: N/A;    Medications: Prior to Admission medications   Medication Sig Start Date End Date Taking? Authorizing Provider  Biotin 5000 MCG TABS Take 1 tablet by mouth daily.   Yes Historical Provider, MD  diclofenac sodium (VOLTAREN) 1 % GEL Apply 2 g topically 4 (four) times daily as needed (Pain).   Yes Historical Provider, MD  fenofibrate  160 MG tablet Take 160 mg by mouth at bedtime.   Yes Historical Provider, MD  hydrochlorothiazide (HYDRODIURIL) 25 MG tablet Take 25 mg by mouth daily as needed (Blood Pressure).    Yes Historical Provider, MD  levothyroxine (SYNTHROID, LEVOTHROID) 100 MCG tablet Take 100 mcg by mouth daily before breakfast.   Yes Historical Provider, MD  lisinopril (PRINIVIL,ZESTRIL) 10 MG tablet Take 10 mg by mouth every morning.   Yes Historical Provider, MD  loratadine (CLARITIN) 10 MG tablet Take 10 mg by mouth every morning.   Yes Historical Provider, MD  methocarbamol (ROBAXIN) 500 MG tablet Take 500 mg by mouth every 6 (six) hours as needed for muscle spasms.   Yes Historical Provider, MD   metoprolol succinate (TOPROL-XL) 50 MG 24 hr tablet Take 50 mg by mouth every morning. Take with or immediately following a meal.   Yes Historical Provider, MD  oxyCODONE-acetaminophen (PERCOCET) 7.5-325 MG tablet Take 1 tablet by mouth every 6 (six) hours as needed for moderate pain.   Yes Historical Provider, MD  zolpidem (AMBIEN) 10 MG tablet Take 3.33 mg by mouth at bedtime as needed for sleep (Patient usually cuts it into a third).   Yes Historical Provider, MD  cyclobenzaprine (FLEXERIL) 10 MG tablet Take 10 mg by mouth 3 (three) times daily.    Historical Provider, MD  HYDROcodone-acetaminophen (NORCO/VICODIN) 5-325 MG per tablet Take 1-2 tablets by mouth every 4 (four) hours as needed. 10/07/13   Virgel Manifold, MD    Allergies:   Allergies  Allergen Reactions  . Erythromycin Other (See Comments)    Upsets stomach  . Morphine And Related Other (See Comments)    HALLUCINATIONS  . Nsaids Other (See Comments)    Kidney Failure    Social History:  reports that she has never smoked. She has never used smokeless tobacco. She reports that she does not drink alcohol or use illicit drugs.  Family History: Family History  Problem Relation Age of Onset  . Cancer Mother     colon cancer?  . Heart disease Father     Physical Exam: Filed Vitals:   06/29/15 2154 06/29/15 2340  BP: 152/73 164/82  Pulse: 94 86  Temp: 100.7 F (38.2 C)   TempSrc: Temporal   Resp: 18 20  Height: 5' (1.524 m)   Weight: 76.204 kg (168 lb)   SpO2: 98% 100%   General appearance: alert, cooperative and no distress Head: Normocephalic, without obvious abnormality, atraumatic Eyes: negative Nose: Nares normal. Septum midline. Mucosa normal. No drainage or sinus tenderness. Neck: no JVD and supple, symmetrical, trachea midline Lungs: clear to auscultation bilaterally Heart: regular rate and rhythm, S1, S2 normal, no murmur, click, rub or gallop Abdomen: soft, non-tender; bowel sounds normal; no masses,   no organomegaly Extremities: extremities normal, atraumatic, no cyanosis or edema Pulses: 2+ and symmetric Skin: Skin color, texture, turgor normal. No rashes or lesions Neurologic: Grossly normal   Labs on Admission:   Recent Labs  06/29/15 2250  NA 138  K 3.6  CL 103  CO2 25  GLUCOSE 223*  BUN 18  CREATININE 0.81  CALCIUM 9.4    Recent Labs  06/29/15 2250  AST 23  ALT 28  ALKPHOS 82  BILITOT 0.5  PROT 7.5  ALBUMIN 4.1    Recent Labs  06/29/15 2250  LIPASE 42    Recent Labs  06/29/15 2250  WBC 12.9*  HGB 13.6  HCT 40.7  MCV 81.4  PLT 310  Radiological Exams on Admission: Ct Renal Stone Study  06/30/2015  CLINICAL DATA:  Lower abdominal pain. Right greater than left flank pain. History of renal calculi. EXAM: CT ABDOMEN AND PELVIS WITHOUT CONTRAST TECHNIQUE: Multidetector CT imaging of the abdomen and pelvis was performed following the standard protocol without IV contrast. COMPARISON:  08/06/2014 FINDINGS: Lower chest: Dependent subsegmental atelectasis in the posterior basal segment right lower lobe. Hepatobiliary: Cholecystectomy. Pancreas: Unremarkable Spleen: Unremarkable Adrenals/Urinary Tract: Adrenal glands and kidneys normal. There is potentially secondary inflammation of the right ureter due to proximity to the sigmoid diverticulitis. Stomach/Bowel: Gastric fundoplication. Speckled density in the colon, probably barium more dietary. Prominently inflamed sigmoid colon diverticula along the dorsal margin of the sigmoid colon on image 55 series 2 and image 52 series 5, tiny locular adjacent gas such that contained local micro perforation is not excluded. Extensive surrounding stranding. No discrete abscess. There is also sigmoid diverticulosis and scattered diverticula in the rest of the colon, particularly the descending colon. Appendix poorly seen, possibly partially obscured by the inflammatory stranding, but not thought to be the epicenter of the  inflammatory process. Vascular/Lymphatic: Mild aortoiliac atherosclerosis. Reproductive: Uterus absent. Ovaries unremarkable wall of the right ovary is partially obscured by the inflammatory stranding from these diverticulitis. Other: No supplemental non-categorized findings. Musculoskeletal: Abnormal 4.2 by 2.7 by 3.0 cm hypodensity within or along the gluteus minimus muscle on the right, with some muscular expansion as shown on image 65 series 2. Exaggerated lumbar lordosis with 3 mm anterolisthesis at L3-4 and 6 mm anterolisthesis at L4-5, with degenerative facet arthropathy but no pars defects. Multilevel mild to moderate foraminal impingement in the lumbar spine. Lower thoracic spondylosis. IMPRESSION: 1. Acute sigmoid colon diverticulitis, with considerable surrounding inflammatory stranding, and possible localize contain micro perforation, but no discrete fluid-filled abscess or true free intraperitoneal gas. No pneumatosis. There is also descending and sigmoid colon diverticulosis. The dominant Li inflamed diverticulum appears to have some internal high density material, probably left over barium from a prior barium procedure. 2. Abnormal hypodensity within the right gluteus medius, probably a fluid collection in the setting of bursitis, although not entirely specific. This could be further investigated in the nonacute setting by ultrasound or MRI if clinically warranted. 3. Mild atherosclerosis. 4. Exaggerated lumbar lordosis with multilevel considerable degenerative facet arthropathy, and degenerative subluxations. Multilevel mild to moderate foraminal impingement in the lumbar spine. Electronically Signed   By: Van Clines M.D.   On: 06/30/2015 00:10      Assessment/Plan  65 yo female with acute diverticulitis and possible microperforation  Principal Problem:   Acute diverticulitis of intestine-  Place on unasyn.  npo.  Abdominal exam is benign.    Active Problems:   GERD  (gastroesophageal reflux disease)   Hypertension   Abdominal pain, acute, bilateral lower quadrant   History of kidney stones  obs on medical.  Full code.  DAVID,RACHAL A 06/30/2015, 1:21 AM

## 2015-06-30 NOTE — ED Notes (Signed)
Called into patients room. Patient c/o redness with itching to right arm after antibiotics started. Stopped antibiotics and flushed IV at this time, provider at bedside to evaluate patient.

## 2015-06-30 NOTE — ED Notes (Signed)
Patient ambulated to restroom, tolerated well. 

## 2015-07-01 ENCOUNTER — Inpatient Hospital Stay (HOSPITAL_COMMUNITY): Payer: PPO

## 2015-07-01 LAB — CBC
HEMATOCRIT: 37.1 % (ref 36.0–46.0)
HEMOGLOBIN: 12.2 g/dL (ref 12.0–15.0)
MCH: 26.9 pg (ref 26.0–34.0)
MCHC: 32.9 g/dL (ref 30.0–36.0)
MCV: 81.9 fL (ref 78.0–100.0)
Platelets: 288 10*3/uL (ref 150–400)
RBC: 4.53 MIL/uL (ref 3.87–5.11)
RDW: 13.6 % (ref 11.5–15.5)
WBC: 15.8 10*3/uL — AB (ref 4.0–10.5)

## 2015-07-01 LAB — URINE CULTURE: Culture: NO GROWTH

## 2015-07-01 LAB — HEMOGLOBIN A1C
Hgb A1c MFr Bld: 6.7 % — ABNORMAL HIGH (ref 4.8–5.6)
Mean Plasma Glucose: 146 mg/dL

## 2015-07-01 MED ORDER — SENNOSIDES-DOCUSATE SODIUM 8.6-50 MG PO TABS
1.0000 | ORAL_TABLET | Freq: Two times a day (BID) | ORAL | Status: DC
  Administered 2015-07-01 – 2015-07-03 (×3): 1 via ORAL
  Filled 2015-07-01 (×4): qty 1

## 2015-07-01 NOTE — Progress Notes (Signed)
[facility] PROGRESS NOTE  Kim Warren [id number] DOB: October 12, 1950 DOA: 06/29/2015 PCP: [name], MD  Assessment/Plan: 65 y/o female with PMH of HTN, Hypothyroidism, Chronic Pains, Fibromyalgia, h/o Diverticulitis presented with abdominal pains and fever  -admitted with acute diverticulitis   1. Acute diverticulitis. CT abd: Acute sigmoid colon diverticulitis, with considerable surrounding inflammatory stranding, and possible localize contain micro perforation, but no discrete fluid-filled abscess or true free intraperitoneal gas. No pneumatosis. Pt had some rash with cipro/flagyl in ED then changed to Unasyn on admission.  -changed to IV zosyn (1/2)-> broaden for GI coverage. Cont antiemetics, pain control. Patient is afebrile, recheck WBC AM. We will try clear liquid diet today if tolerated. D/w patient, will need eventually colonoscopy in 4-6 weeks   2. CT showed Abnormal hypodensity within the right gluteus medius, probably a fluid collection in the setting of bursitis, although not entirely specific.  -we will obtain ultrasound for further evaluation r/o infection  3. HTN. stable. Cont home regimen  4. Chronic Pains, Fibromyalgia. Cont pain control with acute pain, diverticulitis. avoid overdose     Code Status: full Family Communication: d/w patient (indicate person spoken with, relationship, and if by phone, the number) Disposition Plan: home 2-3 days     Consultants:  none  Procedures:  none  Antibiotics:  Cipro/flagyl 06/30/15, Unasyn one dose 06/30/15  Zosyn 1/2>>>> (indicate start date, and stop date if known)  HPI/Subjective: Alert. No distress   Objective: Filed Vitals:   06/30/15 2052 07/01/15 0505  BP: 150/89 142/54  Pulse: 87 85  Temp: 98.8 F (37.1 C) 98.9 F (37.2 C)  Resp: 18 18    Intake/Output Summary (Last 24 hours) at 07/01/15 0822 Last data filed at 07/01/15 0655  Gross per 24 hour  Intake   1300 ml  Output      0 ml  Net    1300 ml   Filed Weights   06/29/15 2154 06/30/15 0637  Weight: 76.204 kg (168 lb) 79.742 kg (175 lb 12.8 oz)    Exam:   General:  Alert, oriented   Cardiovascular: s1, s2 rrr  Respiratory: CTA BL   Abdomen: soft, mild LLQ pain. No rebound   Musculoskeletal: no leg edem a   Data Reviewed: Basic Metabolic Panel:  Recent Labs Lab 06/29/15 2250 06/30/15 0459  NA 138 142  K 3.6 3.7  CL 103 108  CO2 25 27  GLUCOSE 223* 112*  BUN 18 15  CREATININE 0.81 0.82  CALCIUM 9.4 9.0   Liver Function Tests:  Recent Labs Lab 06/29/15 2250  AST 23  ALT 28  ALKPHOS 82  BILITOT 0.5  PROT 7.5  ALBUMIN 4.1    Recent Labs Lab 06/29/15 2250  LIPASE 42   No results for input(s): AMMONIA in the last 168 hours. CBC:  Recent Labs Lab 06/29/15 2250 06/30/15 0459 07/01/15 0555  WBC 12.9* 14.1* 15.8*  HGB 13.6 13.0 12.2  HCT 40.7 39.1 37.1  MCV 81.4 82.1 81.9  PLT 310 298 288   Cardiac Enzymes: No results for input(s): CKTOTAL, CKMB, CKMBINDEX, TROPONINI in the last 168 hours. BNP (last 3 results) No results for input(s): BNP in the last 8760 hours.  ProBNP (last 3 results) No results for input(s): PROBNP in the last 8760 hours.  CBG:  Recent Labs Lab 06/30/15 0318  GLUCAP 97    No results found for this or any previous visit (from the past 240 hour(s)).   Studies: Ct Renal Stone Study  06/30/2015  CLINICAL DATA:  Lower abdominal pain. Right greater than left flank pain. History of renal calculi. EXAM: CT ABDOMEN AND PELVIS WITHOUT CONTRAST TECHNIQUE: Multidetector CT imaging of the abdomen and pelvis was performed following the standard protocol without IV contrast. COMPARISON:  08/06/2014 FINDINGS: Lower chest: Dependent subsegmental atelectasis in the posterior basal segment right lower lobe. Hepatobiliary: Cholecystectomy. Pancreas: Unremarkable Spleen: Unremarkable Adrenals/Urinary Tract: Adrenal glands and kidneys normal. There is potentially secondary  inflammation of the right ureter due to proximity to the sigmoid diverticulitis. Stomach/Bowel: Gastric fundoplication. Speckled density in the colon, probably barium more dietary. Prominently inflamed sigmoid colon diverticula along the dorsal margin of the sigmoid colon on image 55 series 2 and image 52 series 5, tiny locular adjacent gas such that contained local micro perforation is not excluded. Extensive surrounding stranding. No discrete abscess. There is also sigmoid diverticulosis and scattered diverticula in the rest of the colon, particularly the descending colon. Appendix poorly seen, possibly partially obscured by the inflammatory stranding, but not thought to be the epicenter of the inflammatory process. Vascular/Lymphatic: Mild aortoiliac atherosclerosis. Reproductive: Uterus absent. Ovaries unremarkable wall of the right ovary is partially obscured by the inflammatory stranding from these diverticulitis. Other: No supplemental non-categorized findings. Musculoskeletal: Abnormal 4.2 by 2.7 by 3.0 cm hypodensity within or along the gluteus minimus muscle on the right, with some muscular expansion as shown on image 65 series 2. Exaggerated lumbar lordosis with 3 mm anterolisthesis at L3-4 and 6 mm anterolisthesis at L4-5, with degenerative facet arthropathy but no pars defects. Multilevel mild to moderate foraminal impingement in the lumbar spine. Lower thoracic spondylosis. IMPRESSION: 1. Acute sigmoid colon diverticulitis, with considerable surrounding inflammatory stranding, and possible localize contain micro perforation, but no discrete fluid-filled abscess or true free intraperitoneal gas. No pneumatosis. There is also descending and sigmoid colon diverticulosis. The dominant Li inflamed diverticulum appears to have some internal high density material, probably left over barium from a prior barium procedure. 2. Abnormal hypodensity within the right gluteus medius, probably a fluid collection in  the setting of bursitis, although not entirely specific. This could be further investigated in the nonacute setting by ultrasound or MRI if clinically warranted. 3. Mild atherosclerosis. 4. Exaggerated lumbar lordosis with multilevel considerable degenerative facet arthropathy, and degenerative subluxations. Multilevel mild to moderate foraminal impingement in the lumbar spine. Electronically Signed   By: Van Clines M.D.   On: 06/30/2015 00:10    Scheduled Meds: . levothyroxine  100 mcg Oral QAC breakfast  . lisinopril  10 mg Oral q morning - 10a  . metoprolol succinate  50 mg Oral q morning - 10a  . piperacillin-tazobactam (ZOSYN)  IV  3.375 g Intravenous 3 times per day   Continuous Infusions: . sodium chloride 100 mL/hr at 07/01/15 0214    Principal Problem:   Acute diverticulitis of intestine Active Problems:   GERD (gastroesophageal reflux disease)   Hypertension   Abdominal pain, acute, bilateral lower quadrant   History of kidney stones   Diverticulitis of colon    Time spent: >35 minutes     Kinnie Feil  [facility] Pager [phone]. If 7PM-7AM, please contact night-coverage at [email or web address], password [facility] 07/01/2015, 8:22 AM  LOS: 1 day

## 2015-07-01 NOTE — Care Management Note (Signed)
Case Management Note  Patient Details  Name: Kim Warren MRN: [id number] Date of Birth: 1950/12/21  Subjective/Objective:                  Pt is from home, lives with son and is ind with ADL's. Pt has no HH services or DME's prior to admission. Pt plans to return home with self care.   Action/Plan: No CM needs.   Expected Discharge Date:     07/01/15             Expected Discharge Plan:  Home/Self Care  In-House Referral:  NA  Discharge planning Services  CM Consult  Post Acute Care Choice:  NA Choice offered to:  NA  DME Arranged:    DME Agency:     HH Arranged:    HH Agency:     Status of Service:  Completed, signed off  Medicare Important Message Given:    Date Medicare IM Given:    Medicare IM give by:    Date Additional Medicare IM Given:    Additional Medicare Important Message give by:     If discussed at [place] of Stay Meetings, dates discussed:    Additional Comments:  Sherald Barge, RN 07/01/2015, 3:16 PM

## 2015-07-02 DIAGNOSIS — K5792 Diverticulitis of intestine, part unspecified, without perforation or abscess without bleeding: Secondary | ICD-10-CM

## 2015-07-02 DIAGNOSIS — K5732 Diverticulitis of large intestine without perforation or abscess without bleeding: Principal | ICD-10-CM

## 2015-07-02 DIAGNOSIS — E119 Type 2 diabetes mellitus without complications: Secondary | ICD-10-CM

## 2015-07-02 DIAGNOSIS — K219 Gastro-esophageal reflux disease without esophagitis: Secondary | ICD-10-CM

## 2015-07-02 DIAGNOSIS — Z87442 Personal history of urinary calculi: Secondary | ICD-10-CM

## 2015-07-02 DIAGNOSIS — I10 Essential (primary) hypertension: Secondary | ICD-10-CM

## 2015-07-02 DIAGNOSIS — R1032 Left lower quadrant pain: Secondary | ICD-10-CM

## 2015-07-02 DIAGNOSIS — R1031 Right lower quadrant pain: Secondary | ICD-10-CM

## 2015-07-02 LAB — CBC
HCT: 33.7 % — ABNORMAL LOW (ref 36.0–46.0)
Hemoglobin: 11 g/dL — ABNORMAL LOW (ref 12.0–15.0)
MCH: 26.8 pg (ref 26.0–34.0)
MCHC: 32.6 g/dL (ref 30.0–36.0)
MCV: 82.2 fL (ref 78.0–100.0)
PLATELETS: 259 10*3/uL (ref 150–400)
RBC: 4.1 MIL/uL (ref 3.87–5.11)
RDW: 13.7 % (ref 11.5–15.5)
WBC: 9.9 10*3/uL (ref 4.0–10.5)

## 2015-07-02 NOTE — Progress Notes (Signed)
[facility] PROGRESS NOTE  Kim Warren [id number] DOB: [birth year] DOA: 06/29/2015 PCP: [name], MD    HPI/Subjective: Feels much better, denies any fever or chills, tolerated the full liquids, will advance to regular diet.   Assessment/Plan: 65 y/o female with PMH of HTN, Hypothyroidism, Chronic Pains, Fibromyalgia, h/o Diverticulitis presented with abdominal pains and fever  -admitted with acute diverticulitis   Acute diverticulitis CT abd: Acute sigmoid colon diverticulitis, with considerable surrounding inflammatory stranding, and possible localize contain micro perforation, but no discrete fluid-filled abscess or true free intraperitoneal gas. No pneumatosis. Pt had some rash with cipro/flagyl in ED then changed to Zosyn on admission.  -changed to IV zosyn (1/2)-> broaden for GI coverage. Cont antiemetics, pain control. Patient is afebrile, recheck WBC AM.  Tolerated full liquids, switched to regular diet today. If tolerated can be probably discharged on oral antibiotics in a.m.  CT showed Abnormal hypodensity within the right gluteus medius, probably a fluid collection in the setting of bursitis, although not entirely specific.  Ultrasound showed fluid collection, hematoma versus seroma or unlikely abscess. This probably need follow-up as outpatient, patient is asymptomatic, total incidental finding.   HTN. stable. Cont home regimen   Chronic Pains, Fibromyalgia. Cont pain control with acute pain, diverticulitis. avoid overdose     Code Status: full Family Communication: d/w patient (indicate person spoken with, relationship, and if by phone, the number) Disposition Plan: home 1-2 days     Consultants:  none  Procedures:  none  Antibiotics:  Cipro/flagyl 06/30/15, Unasyn one dose 06/30/15  Zosyn 1/2>>>> (indicate start date, and stop date if known)   Objective: Filed Vitals:   07/01/15 2125 07/02/15 0718  BP: 123/58 107/54  Pulse: 74 69  Temp:  98.5 F (36.9 C) 98.1 F (36.7 C)  Resp: 20 20    Intake/Output Summary (Last 24 hours) at 07/02/15 1029 Last data filed at 07/02/15 [id number]  Gross per 24 hour  Intake 3223.33 ml  Output    600 ml  Net 2623.33 ml   Filed Weights   06/29/15 2154 06/30/15 0637  Weight: 76.204 kg (168 lb) 79.742 kg (175 lb 12.8 oz)    Exam:   General:  Alert, oriented   Cardiovascular: s1, s2 rrr  Respiratory: CTA BL   Abdomen: soft, mild LLQ pain. No rebound   Musculoskeletal: no leg edem a   Data Reviewed: Basic Metabolic Panel:  Recent Labs Lab 06/29/15 2250 06/30/15 0459  NA 138 142  K 3.6 3.7  CL 103 108  CO2 25 27  GLUCOSE 223* 112*  BUN 18 15  CREATININE 0.81 0.82  CALCIUM 9.4 9.0   Liver Function Tests:  Recent Labs Lab 06/29/15 2250  AST 23  ALT 28  ALKPHOS 82  BILITOT 0.5  PROT 7.5  ALBUMIN 4.1    Recent Labs Lab 06/29/15 2250  LIPASE 42   No results for input(s): AMMONIA in the last 168 hours. CBC:  Recent Labs Lab 06/29/15 2250 06/30/15 0459 07/01/15 0555 07/02/15 0612  WBC 12.9* 14.1* 15.8* 9.9  HGB 13.6 13.0 12.2 11.0*  HCT 40.7 39.1 37.1 33.7*  MCV 81.4 82.1 81.9 82.2  PLT 310 298 288 259   Cardiac Enzymes: No results for input(s): CKTOTAL, CKMB, CKMBINDEX, TROPONINI in the last 168 hours. BNP (last 3 results) No results for input(s): BNP in the last 8760 hours.  ProBNP (last 3 results) No results for input(s): PROBNP in the last 8760 hours.  CBG:  Recent Labs Lab  06/30/15 0318  GLUCAP 97    Recent Results (from the past 240 hour(s))  Urine culture     Status: None   Collection Time: 06/29/15 11:34 PM  Result Value Ref Range Status   Specimen Description URINE, CLEAN CATCH  Final   Special Requests NONE  Final   Culture   Final    NO GROWTH 1 DAY Performed at [facility]    Report Status 07/01/2015 FINAL  Final     Studies: US Pelvis Limited  07/01/2015  CLINICAL DATA:  Fluid collection seen on CT in right  gluteus musculature EXAM: US PELVIS LIMITED TECHNIQUE: Ultrasound examination of the pelvic soft tissues was performed in the area of clinical concern. COMPARISON:  CT 06/29/2015 FINDINGS: Ultrasound in the area of concern along the right lateral hip region demonstrates a complex fluid collection overlying the proximal right femur measuring 7.2 x 3.6 x 2.4 cm. Internal echoes and septations. IMPRESSION: Complex fluid collection in the lateral right hip in the area noted on CT. This could reflect hematoma, abscess or complex seroma. Electronically Signed   By: Rolm Baptise M.D.   On: 07/01/2015 11:51    Scheduled Meds: . levothyroxine  100 mcg Oral QAC breakfast  . lisinopril  10 mg Oral q morning - 10a  . metoprolol succinate  50 mg Oral q morning - 10a  . piperacillin-tazobactam (ZOSYN)  IV  3.375 g Intravenous 3 times per day  . senna-docusate  1 tablet Oral BID   Continuous Infusions: . sodium chloride 100 mL/hr at 07/01/15 2245    Principal Problem:   Acute diverticulitis of intestine Active Problems:   GERD (gastroesophageal reflux disease)   Hypertension   Abdominal pain, acute, bilateral lower quadrant   History of kidney stones   Diverticulitis of colon    Time spent: >35 minutes     [facility]  [facility] Pager [phone]. If 7PM-7AM, please contact night-coverage at [email or web address], password [facility] 07/02/2015, 10:29 AM  LOS: 2 days

## 2015-07-03 DIAGNOSIS — E119 Type 2 diabetes mellitus without complications: Secondary | ICD-10-CM

## 2015-07-03 MED ORDER — AMOXICILLIN-POT CLAVULANATE 875-125 MG PO TABS: 1.0000 | ORAL_TABLET | Freq: Two times a day (BID) | ORAL | Status: DC

## 2015-07-03 NOTE — Progress Notes (Signed)
Patient states understanding of discharge instructions.  

## 2015-07-03 NOTE — Care Management Note (Signed)
Case Management Note  Patient Details  Name: TERYL OSTERHOUDT MRN: [id number] Date of Birth: 1951-04-24  Subjective/Objective:                    Action/Plan:   Expected Discharge Date:                  Expected Discharge Plan:  Home/Self Care  In-House Referral:  NA  Discharge planning Services  CM Consult  Post Acute Care Choice:  NA Choice offered to:  NA  DME Arranged:    DME Agency:     HH Arranged:    HH Agency:     Status of Service:  Completed, signed off  Medicare Important Message Given:  Yes Date Medicare IM Given:    Medicare IM give by:    Date Additional Medicare IM Given:    Additional Medicare Important Message give by:     If discussed at [place] of Stay Meetings, dates discussed:    Additional Comments: Pt discharged home today. No CM needs noted. Christinia Gully Chacra, RN 07/03/2015, 10:30 AM

## 2015-07-03 NOTE — Discharge Summary (Signed)
Physician Discharge Summary  Kim Warren [id number] DOB: 1950-10-02 DOA: 06/29/2015  PCP: [name], MD  Admit date: 06/29/2015 Discharge date: 07/03/2015  Time spent: 40 minutes  Recommendations for Outpatient Follow-up:  1. Follow up with PCP in 1 week   Discharge Diagnoses:  Principal Problem:   Acute diverticulitis of intestine Active Problems:   GERD (gastroesophageal reflux disease)   Hypertension   Abdominal pain, acute, bilateral lower quadrant   History of kidney stones   Diverticulitis of colon   DM type 2 (diabetes mellitus, type 2) ([place])   Discharge Condition: Stable  Diet recommendation: Carbohydrate modified diet  Filed Weights   06/29/15 2154 06/30/15 0637  Weight: 76.204 kg (168 lb) 79.742 kg (175 lb 12.8 oz)    History of present illness:  65 yo female h/o htn, fibromyalgia, comes in with one day of bilateral lower abdominal pain right more than left with associated fever. This am it started low grade then spiked later in the evening to 101 with progressive worsening of her abdominal pain. She thought maybe it was another kidney stone but this felt much different than her previous kidney stones. Denies any n/v/d. Ct shows acute diverticulitis with microperf. Pt has one other episode of diverticulitis many years ago. Referred for admission for her infection.  Of note, pt was getting cipro and flagyl at the same time in her iv in the ED per report by floor nursing staff and broke out in rash, allergic reaction abx were stopped and she was given unasyn instead. Pt has taken both cipro and flagyl in past without problems.  Hospital Course:   Acute diverticulitis -Presented with abdominal pain and nausea. -CT abdomen/pelvis showed acute sigmoid reticulitis, possible localized contained microperforation. No abscess. -Started on Cipro and Flagyl in the emergency department, developed rash scope was discontinued. -Treated with IV Zosyn while she was  in the hospital. -Initially she was NPO, diet as carefully she tolerated that very well. -On discharge Augmentin for 5 more days.  New onset diabetes mellitus type 2 -Hemoglobin A1c of 6.7, correlated with mean blood glucose of 146. -This discussed with the patient in details, no medication for now. -Counseled about carbohydrate modified diet and exercise, patient to follow-up with [name] for further management.  Fluid collection -CT showed Abnormal 4.2 by 2.7 by 3.0 cm hypodensity within the right gluteus medius, probably a fluid collection in the setting of bursitis, although not entirely specific.  -Ultrasound showed fluid collection, hematoma versus seroma or unlikely abscess. -This probably need follow-up as outpatient, patient is asymptomatic, total incidental finding.   HTN -Stable. Cont home regimen   Chronic Pains, Fibromyalgia -Cont pain control with acute pain, diverticulitis.    Procedures:  None  Consultations:  None  Discharge Exam: Filed Vitals:   07/02/15 2207 07/03/15 0624  BP: 141/90 144/81  Pulse: 73 71  Temp: 97.9 F (36.6 C) 97.7 F (36.5 C)  Resp: 20 20   General: Alert and awake, oriented x3, not in any acute distress. HEENT: anicteric sclera, pupils reactive to light and accommodation, EOMI CVS: S1-S2 clear, no murmur rubs or gallops Chest: clear to auscultation bilaterally, no wheezing, rales or rhonchi Abdomen: soft nontender, nondistended, normal bowel sounds, no organomegaly Extremities: no cyanosis, clubbing or edema noted bilaterally Neuro: Cranial nerves II-XII intact, no focal neurological deficits  Discharge Instructions   Discharge Instructions    Diet - low sodium heart healthy    Complete by:  As directed  Increase activity slowly    Complete by:  As directed           Current Discharge Medication List    START taking these medications   Details  amoxicillin-clavulanate (AUGMENTIN) 875-125 MG tablet Take 1  tablet by mouth 2 (two) times daily. Qty: 10 tablet, Refills: 0      CONTINUE these medications which have NOT CHANGED   Details  Biotin 5000 MCG TABS Take 1 tablet by mouth daily.    diclofenac sodium (VOLTAREN) 1 % GEL Apply 2 g topically 4 (four) times daily as needed (Pain).    fenofibrate 160 MG tablet Take 160 mg by mouth at bedtime.    hydrochlorothiazide (HYDRODIURIL) 25 MG tablet Take 25 mg by mouth daily as needed (Blood Pressure).     levothyroxine (SYNTHROID, LEVOTHROID) 100 MCG tablet Take 100 mcg by mouth daily before breakfast.    lisinopril (PRINIVIL,ZESTRIL) 10 MG tablet Take 10 mg by mouth every morning.    loratadine (CLARITIN) 10 MG tablet Take 10 mg by mouth every morning.    methocarbamol (ROBAXIN) 500 MG tablet Take 500 mg by mouth every 6 (six) hours as needed for muscle spasms.    metoprolol succinate (TOPROL-XL) 50 MG 24 hr tablet Take 50 mg by mouth every morning. Take with or immediately following a meal.    oxyCODONE-acetaminophen (PERCOCET) 7.5-325 MG tablet Take 1 tablet by mouth every 6 (six) hours as needed for moderate pain.    zolpidem (AMBIEN) 10 MG tablet Take 3.33 mg by mouth at bedtime as needed for sleep (Patient usually cuts it into a third).      STOP taking these medications     cyclobenzaprine (FLEXERIL) 10 MG tablet      HYDROcodone-acetaminophen (NORCO/VICODIN) 5-325 MG per tablet        Allergies  Allergen Reactions  . Erythromycin Other (See Comments)    Upsets stomach  . Morphine And Related Other (See Comments)    HALLUCINATIONS  . Nsaids Other (See Comments)    Kidney Failure  . Ciprofloxacin Rash  . Flagyl [Metronidazole] Rash   Follow-up Information    Follow up with Collene Mares, PA-C On 07/10/2015.   Specialties:  Physician Assistant, Internal Medicine   Why:  at 11:00 am   Contact information:   [address] [place] [phone]        The results of significant diagnostics from  this hospitalization (including imaging, microbiology, ancillary and laboratory) are listed below for reference.    Significant Diagnostic Studies: US Pelvis Limited  07/01/2015  CLINICAL DATA:  Fluid collection seen on CT in right gluteus musculature EXAM: US PELVIS LIMITED TECHNIQUE: Ultrasound examination of the pelvic soft tissues was performed in the area of clinical concern. COMPARISON:  CT 06/29/2015 FINDINGS: Ultrasound in the area of concern along the right lateral hip region demonstrates a complex fluid collection overlying the proximal right femur measuring 7.2 x 3.6 x 2.4 cm. Internal echoes and septations. IMPRESSION: Complex fluid collection in the lateral right hip in the area noted on CT. This could reflect hematoma, abscess or complex seroma. Electronically Signed   By: Rolm Baptise M.D.   On: 07/01/2015 11:51   Ct Renal Stone Study  06/30/2015  CLINICAL DATA:  Lower abdominal pain. Right greater than left flank pain. History of renal calculi. EXAM: CT ABDOMEN AND PELVIS WITHOUT CONTRAST TECHNIQUE: Multidetector CT imaging of the abdomen and pelvis was performed following the standard protocol without IV contrast. COMPARISON:  08/06/2014 FINDINGS: Lower chest: Dependent subsegmental atelectasis in the posterior basal segment right lower lobe. Hepatobiliary: Cholecystectomy. Pancreas: Unremarkable Spleen: Unremarkable Adrenals/Urinary Tract: Adrenal glands and kidneys normal. There is potentially secondary inflammation of the right ureter due to proximity to the sigmoid diverticulitis. Stomach/Bowel: Gastric fundoplication. Speckled density in the colon, probably barium more dietary. Prominently inflamed sigmoid colon diverticula along the dorsal margin of the sigmoid colon on image 55 series 2 and image 52 series 5, tiny locular adjacent gas such that contained local micro perforation is not excluded. Extensive surrounding stranding. No discrete abscess. There is also sigmoid diverticulosis and  scattered diverticula in the rest of the colon, particularly the descending colon. Appendix poorly seen, possibly partially obscured by the inflammatory stranding, but not thought to be the epicenter of the inflammatory process. Vascular/Lymphatic: Mild aortoiliac atherosclerosis. Reproductive: Uterus absent. Ovaries unremarkable wall of the right ovary is partially obscured by the inflammatory stranding from these diverticulitis. Other: No supplemental non-categorized findings. Musculoskeletal: Abnormal 4.2 by 2.7 by 3.0 cm hypodensity within or along the gluteus minimus muscle on the right, with some muscular expansion as shown on image 65 series 2. Exaggerated lumbar lordosis with 3 mm anterolisthesis at L3-4 and 6 mm anterolisthesis at L4-5, with degenerative facet arthropathy but no pars defects. Multilevel mild to moderate foraminal impingement in the lumbar spine. Lower thoracic spondylosis. IMPRESSION: 1. Acute sigmoid colon diverticulitis, with considerable surrounding inflammatory stranding, and possible localize contain micro perforation, but no discrete fluid-filled abscess or true free intraperitoneal gas. No pneumatosis. There is also descending and sigmoid colon diverticulosis. The dominant Li inflamed diverticulum appears to have some internal high density material, probably left over barium from a prior barium procedure. 2. Abnormal hypodensity within the right gluteus medius, probably a fluid collection in the setting of bursitis, although not entirely specific. This could be further investigated in the nonacute setting by ultrasound or MRI if clinically warranted. 3. Mild atherosclerosis. 4. Exaggerated lumbar lordosis with multilevel considerable degenerative facet arthropathy, and degenerative subluxations. Multilevel mild to moderate foraminal impingement in the lumbar spine. Electronically Signed   By: Van Clines M.D.   On: 06/30/2015 00:10    Microbiology: Recent Results (from the  past 240 hour(s))  Urine culture     Status: None   Collection Time: 06/29/15 11:34 PM  Result Value Ref Range Status   Specimen Description URINE, CLEAN CATCH  Final   Special Requests NONE  Final   Culture   Final    NO GROWTH 1 DAY Performed at [facility]    Report Status 07/01/2015 FINAL  Final     Labs: Basic Metabolic Panel:  Recent Labs Lab 06/29/15 2250 06/30/15 0459  NA 138 142  K 3.6 3.7  CL 103 108  CO2 25 27  GLUCOSE 223* 112*  BUN 18 15  CREATININE 0.81 0.82  CALCIUM 9.4 9.0   Liver Function Tests:  Recent Labs Lab 06/29/15 2250  AST 23  ALT 28  ALKPHOS 82  BILITOT 0.5  PROT 7.5  ALBUMIN 4.1    Recent Labs Lab 06/29/15 2250  LIPASE 42   No results for input(s): AMMONIA in the last 168 hours. CBC:  Recent Labs Lab 06/29/15 2250 06/30/15 0459 07/01/15 0555 07/02/15 0612  WBC 12.9* 14.1* 15.8* 9.9  HGB 13.6 13.0 12.2 11.0*  HCT 40.7 39.1 37.1 33.7*  MCV 81.4 82.1 81.9 82.2  PLT 310 298 288 259   Cardiac Enzymes: No results for input(s): CKTOTAL, CKMB, CKMBINDEX,  TROPONINI in the last 168 hours. BNP: BNP (last 3 results) No results for input(s): BNP in the last 8760 hours.  ProBNP (last 3 results) No results for input(s): PROBNP in the last 8760 hours.  CBG:  Recent Labs Lab 06/30/15 0318  GLUCAP 97       Signed:  Shirl Ludington A MD  FACP  [facility] 07/03/2015, 10:19 AM

## 2015-07-03 NOTE — Progress Notes (Signed)
Patient lost iv access this morning. She says that she is supposed to be discharged this morning and would like to leave iv out for now. Contacted hospitalist on call. Agreed that it is okay to leave iv out for now.

## 2015-07-03 NOTE — Care Management Important Message (Signed)
Important Message  Patient Details  Name: Kim Warren MRN: [id number] Date of Birth: 1950-10-28   Medicare Important Message Given:  Yes    Joylene Draft, RN 07/03/2015, 10:30 AM

## 2015-07-10 DIAGNOSIS — E119 Type 2 diabetes mellitus without complications: Secondary | ICD-10-CM | POA: Diagnosis not present

## 2015-07-10 DIAGNOSIS — I10 Essential (primary) hypertension: Secondary | ICD-10-CM | POA: Diagnosis not present

## 2015-07-10 DIAGNOSIS — Z1389 Encounter for screening for other disorder: Secondary | ICD-10-CM | POA: Diagnosis not present

## 2015-07-10 DIAGNOSIS — K5792 Diverticulitis of intestine, part unspecified, without perforation or abscess without bleeding: Secondary | ICD-10-CM | POA: Diagnosis not present

## 2015-07-10 DIAGNOSIS — Z6831 Body mass index (BMI) 31.0-31.9, adult: Secondary | ICD-10-CM | POA: Diagnosis not present

## 2015-07-17 ENCOUNTER — Encounter (INDEPENDENT_AMBULATORY_CARE_PROVIDER_SITE_OTHER): Payer: Self-pay | Admitting: *Deleted

## 2015-07-29 DIAGNOSIS — R490 Dysphonia: Secondary | ICD-10-CM | POA: Diagnosis not present

## 2015-07-29 DIAGNOSIS — K219 Gastro-esophageal reflux disease without esophagitis: Secondary | ICD-10-CM | POA: Diagnosis not present

## 2015-07-29 DIAGNOSIS — J385 Laryngeal spasm: Secondary | ICD-10-CM | POA: Diagnosis not present

## 2015-08-08 DIAGNOSIS — J385 Laryngeal spasm: Secondary | ICD-10-CM | POA: Diagnosis not present

## 2015-08-08 DIAGNOSIS — R490 Dysphonia: Secondary | ICD-10-CM | POA: Diagnosis not present

## 2015-08-19 ENCOUNTER — Ambulatory Visit (INDEPENDENT_AMBULATORY_CARE_PROVIDER_SITE_OTHER): Payer: PPO | Admitting: Internal Medicine

## 2015-08-28 ENCOUNTER — Ambulatory Visit (INDEPENDENT_AMBULATORY_CARE_PROVIDER_SITE_OTHER): Payer: PPO | Admitting: Internal Medicine

## 2015-08-29 ENCOUNTER — Encounter (INDEPENDENT_AMBULATORY_CARE_PROVIDER_SITE_OTHER): Payer: Self-pay | Admitting: Internal Medicine

## 2015-09-05 DIAGNOSIS — J385 Laryngeal spasm: Secondary | ICD-10-CM | POA: Diagnosis not present

## 2015-09-05 DIAGNOSIS — R490 Dysphonia: Secondary | ICD-10-CM | POA: Diagnosis not present

## 2015-09-09 DIAGNOSIS — M519 Unspecified thoracic, thoracolumbar and lumbosacral intervertebral disc disorder: Secondary | ICD-10-CM | POA: Diagnosis not present

## 2015-09-09 DIAGNOSIS — G8929 Other chronic pain: Secondary | ICD-10-CM | POA: Diagnosis not present

## 2015-09-09 DIAGNOSIS — M25519 Pain in unspecified shoulder: Secondary | ICD-10-CM | POA: Diagnosis not present

## 2015-09-09 DIAGNOSIS — I10 Essential (primary) hypertension: Secondary | ICD-10-CM | POA: Diagnosis not present

## 2015-09-09 DIAGNOSIS — Z79899 Other long term (current) drug therapy: Secondary | ICD-10-CM | POA: Diagnosis not present

## 2015-09-09 DIAGNOSIS — M545 Low back pain: Secondary | ICD-10-CM | POA: Diagnosis not present

## 2015-09-09 DIAGNOSIS — G4459 Other complicated headache syndrome: Secondary | ICD-10-CM | POA: Diagnosis not present

## 2015-09-09 DIAGNOSIS — R209 Unspecified disturbances of skin sensation: Secondary | ICD-10-CM | POA: Diagnosis not present

## 2015-09-09 DIAGNOSIS — M1990 Unspecified osteoarthritis, unspecified site: Secondary | ICD-10-CM | POA: Diagnosis not present

## 2015-09-09 DIAGNOSIS — M797 Fibromyalgia: Secondary | ICD-10-CM | POA: Diagnosis not present

## 2015-09-09 DIAGNOSIS — F5109 Other insomnia not due to a substance or known physiological condition: Secondary | ICD-10-CM | POA: Diagnosis not present

## 2015-10-02 ENCOUNTER — Encounter (INDEPENDENT_AMBULATORY_CARE_PROVIDER_SITE_OTHER): Payer: Self-pay | Admitting: *Deleted

## 2015-10-02 ENCOUNTER — Other Ambulatory Visit (INDEPENDENT_AMBULATORY_CARE_PROVIDER_SITE_OTHER): Payer: Self-pay | Admitting: Internal Medicine

## 2015-10-02 ENCOUNTER — Encounter (INDEPENDENT_AMBULATORY_CARE_PROVIDER_SITE_OTHER): Payer: Self-pay | Admitting: Internal Medicine

## 2015-10-02 ENCOUNTER — Ambulatory Visit (INDEPENDENT_AMBULATORY_CARE_PROVIDER_SITE_OTHER): Payer: PPO | Admitting: Internal Medicine

## 2015-10-02 VITALS — BP 122/70 | HR 64 | Temp 98.4°F | Ht 60.0 in | Wt 169.4 lb

## 2015-10-02 DIAGNOSIS — K5732 Diverticulitis of large intestine without perforation or abscess without bleeding: Secondary | ICD-10-CM

## 2015-10-02 DIAGNOSIS — Z1211 Encounter for screening for malignant neoplasm of colon: Secondary | ICD-10-CM

## 2015-10-02 NOTE — Telephone Encounter (Signed)
This encounter was created in error - please disregard.

## 2015-10-02 NOTE — Patient Instructions (Signed)
Colonoscopy.  The risks and benefits such as perforation, bleeding, and infection were reviewed with the patient and is agreeable. 

## 2015-10-02 NOTE — Telephone Encounter (Signed)
Patient needs trilyte 

## 2015-10-02 NOTE — Progress Notes (Addendum)
Subjective:    Patient ID: Kim Warren, female    DOB: 1951/03/26, 65 y.o.   MRN: [id number]  HPI Referred by Dr Hilma Favors for abdominal pain . She tells me she was in the hospital the 1st week of January at [facility]. She had been sick x 1 day with fever.  She was admitted for diverticulitis. CT scan revealed IMPRESSION: 1. Acute sigmoid colon diverticulitis, with considerable surrounding inflammatory stranding, and possible localize contain micro perforation, but no discrete fluid-filled abscess or true free intraperitoneal gas. No pneumatosis. There is also descending and sigmoid colon diverticulosis. The dominant Li inflamed diverticulum appears to have some internal high density material, probably left over barium from a prior barium procedure. 2. Abnormal hypodensity within the right gluteus medius, probably a fluid collection in the setting of bursitis, although not entirely specific. This could be further investigated in the nonacute setting by ultrasound or MRI if clinically warranted. 3. Mild atherosclerosis. 4. Exaggerated lumbar lordosis with multilevel considerable degenerative facet arthropathy, and degenerative subluxations. Multilevel mild to moderate foraminal impingement in the lumbar spine.  She said her entire abdomen was hurting before she was admitted. She was admitted x 5 days. She was covered with Unasyn while in the hospital. Discharge with Augmentin x 5 days.  She apparently had a reaction to the Flagyl and Cipro while in the hospital. She tells me today she dos not have any symptoms. She tells me she feels good. No abdominal pain. BMs are normal. No melena or BRRB. Her appetite is good.   Her last colonoscopy was in 2007 in [place] and was normal.  She has had 2 bouts of diverticulitis that she know of.  Hx of GI bleed in 2007 and underwent a colonoscopy.    05/09/2013 EGD with Bravo placement: on PPI (Dr Paulita Fujita) Small hiatal hernia, otherwise normal esophagus.  Mild gastritis with few antral erosions, otherwise normal stmach and pylorus. Normal duodenum to the second portion.    CBC    Component Value Date/Time   WBC 9.9 07/02/2015 0612   RBC 4.10 07/02/2015 0612   HGB 11.0* 07/02/2015 0612   HCT 33.7* 07/02/2015 0612   PLT 259 07/02/2015 0612   MCV 82.2 07/02/2015 0612   MCH 26.8 07/02/2015 0612   MCHC 32.6 07/02/2015 0612   RDW 13.7 07/02/2015 0612   LYMPHSABS 3.0 09/26/2013 0429   MONOABS 0.9 09/26/2013 0429   EOSABS 0.1 09/26/2013 0429   BASOSABS 0.0 09/26/2013 0429    Hepatic Function Panel     Component Value Date/Time   PROT 7.5 06/29/2015 2250   ALBUMIN 4.1 06/29/2015 2250   AST 23 06/29/2015 2250   ALT 28 06/29/2015 2250   ALKPHOS 82 06/29/2015 2250   BILITOT 0.5 06/29/2015 2250       Review of Systems Past Medical History  Diagnosis Date  . Hypertension   . Hypothyroidism   . GERD (gastroesophageal reflux disease)   . History of kidney stones   . Arthritis   . Headache(784.0)     sinus headaches-seasonal  . Onychomycosis     LEFT BIG TOE  . Neuromuscular disorder (HCC)     fibromyalgia  . Dysphagia   . Palpitations   . Dysrhythmia     BENIGN PVCS  . H/O hiatal hernia   . Fibromyalgia   . Degenerative joint disease of spine     SEVERE LOW BACK PAIN-DDD, SOMETIMES PAIN IN LOWER LEGS;  PT ALSO HAS DDD CERVICAL - WITH NECK  PAIN THAT RADIATES TO BOTH SHOULDERS AND ARMS AND NUMBNESS FINGERS BOTH HANDS.  AS OF 09/18/13 - PT HAS NOTICED WORSENING OF NECK, SHOULDER PAIN - "SEARING PAIN" & FEELING OF ELECTRICAL SENSATIONS - SHE PLANS TO GET IN TOUCH WITH HER NEUROLOGIST THIS WEEK BEFORE PLANNED NISSEN SURG ON 3/30  . Hoarseness of voice     PT RELATES TO GERD PROBLEM    Past Surgical History  Procedure Laterality Date  . Cholecystectomy    . Periosteal chondroma  1979    left middle finger-benign  . Abdominal hysterectomy  1990    partial  . Dilation and curettage of uterus    . Brain surgery  1995     meningioma-benign  . Parathyroid exploration  2011    tumor off-benign  . Esophagogastroduodenoscopy (egd) with propofol N/A 05/09/2013    Procedure: ESOPHAGOGASTRODUODENOSCOPY (EGD) WITH PROPOFOL;  Surgeon: Arta Silence, MD;  Location: [facility];  Service: Endoscopy;  Laterality: N/A;  . Bravo ph study N/A 05/09/2013    Procedure: BRAVO [place];  Surgeon: Arta Silence, MD;  Location: [facility];  Service: Endoscopy;  Laterality: N/A;  . Colonoscopy  06/08/06  . Wisdom tooth extraction    . Laparoscopic nissen fundoplication N/A [id number]    Procedure: LAPAROSCOPIC NISSEN AND HIATAL HERNIA REPAIR;  Surgeon: Pedro Earls, MD;  Location: [facility];  Service: General;  Laterality: N/A;    Allergies  Allergen Reactions  . Erythromycin Other (See Comments)    Upsets stomach  . Morphine And Related Other (See Comments)    HALLUCINATIONS  . Nsaids Other (See Comments)    Kidney Failure  . Ciprofloxacin Rash  . Flagyl [Metronidazole] Rash    Current Outpatient Prescriptions on File Prior to Visit  Medication Sig Dispense Refill  . diclofenac sodium (VOLTAREN) 1 % GEL Apply 2 g topically 4 (four) times daily as needed (Pain). Reported on 10/02/2015    . fenofibrate 160 MG tablet Take 160 mg by mouth at bedtime.    . hydrochlorothiazide (HYDRODIURIL) 25 MG tablet Take 25 mg by mouth daily as needed (Blood Pressure).     Marland Kitchen levothyroxine (SYNTHROID, LEVOTHROID) 100 MCG tablet Take 100 mcg by mouth daily before breakfast.    . lisinopril (PRINIVIL,ZESTRIL) 10 MG tablet Take 10 mg by mouth every morning.    . loratadine (CLARITIN) 10 MG tablet Take 10 mg by mouth every morning.    . methocarbamol (ROBAXIN) 500 MG tablet Take 500 mg by mouth every 6 (six) hours as needed for muscle spasms.    . metoprolol succinate (TOPROL-XL) 50 MG 24 hr tablet Take 50 mg by mouth every morning. Take with or immediately following a meal.    . oxyCODONE-acetaminophen (PERCOCET) 7.5-325 MG tablet Take 1  tablet by mouth every 6 (six) hours as needed for moderate pain.    Marland Kitchen zolpidem (AMBIEN) 10 MG tablet Take 3.33 mg by mouth at bedtime as needed for sleep (Patient usually cuts it into a third).    . Biotin 5000 MCG TABS Take 1 tablet by mouth daily. Reported on 10/02/2015     No current facility-administered medications on file prior to visit.        Objective:   Physical Exam Blood pressure 122/70, pulse 64, temperature 98.4 F (36.9 C), height 5' (1.524 m), weight 169 lb 6.4 oz (76.839 kg). Alert and oriented. Skin warm and dry. Oral mucosa is moist.   . Sclera anicteric, conjunctivae is pink. Thyroid not enlarged. No cervical lymphadenopathy.  Lungs clear. Heart regular rate and rhythm.  Abdomen is soft. Bowel sounds are positive. No hepatomegaly. No abdominal masses felt. No tenderness.  No edema to lower extremities.         Assessment & Plan:  Recent hx of diverticulitis No GI symptoms at this time. . Last colonoscopy was 10 yrs.  Needs screening colonoscopy with Propofol.

## 2015-10-15 DIAGNOSIS — E6609 Other obesity due to excess calories: Secondary | ICD-10-CM | POA: Diagnosis not present

## 2015-10-15 DIAGNOSIS — Z0001 Encounter for general adult medical examination with abnormal findings: Secondary | ICD-10-CM | POA: Diagnosis not present

## 2015-10-15 DIAGNOSIS — Z6830 Body mass index (BMI) 30.0-30.9, adult: Secondary | ICD-10-CM | POA: Diagnosis not present

## 2015-10-15 DIAGNOSIS — E119 Type 2 diabetes mellitus without complications: Secondary | ICD-10-CM | POA: Diagnosis not present

## 2015-10-15 DIAGNOSIS — Z0000 Encounter for general adult medical examination without abnormal findings: Secondary | ICD-10-CM | POA: Diagnosis not present

## 2015-10-15 DIAGNOSIS — Z1389 Encounter for screening for other disorder: Secondary | ICD-10-CM | POA: Diagnosis not present

## 2015-10-15 DIAGNOSIS — E063 Autoimmune thyroiditis: Secondary | ICD-10-CM | POA: Diagnosis not present

## 2015-10-15 DIAGNOSIS — I10 Essential (primary) hypertension: Secondary | ICD-10-CM | POA: Diagnosis not present

## 2015-10-15 DIAGNOSIS — M7541 Impingement syndrome of right shoulder: Secondary | ICD-10-CM | POA: Diagnosis not present

## 2015-10-17 ENCOUNTER — Other Ambulatory Visit (HOSPITAL_COMMUNITY): Payer: Self-pay | Admitting: Physician Assistant

## 2015-10-17 DIAGNOSIS — M8580 Other specified disorders of bone density and structure, unspecified site: Secondary | ICD-10-CM

## 2015-10-22 ENCOUNTER — Other Ambulatory Visit (HOSPITAL_COMMUNITY): Payer: PPO

## 2015-10-26 ENCOUNTER — Emergency Department (HOSPITAL_COMMUNITY)
Admission: EM | Admit: 2015-10-26 | Discharge: 2015-10-26 | Disposition: A | Discharge status: Home / Self Care | Payer: PPO | Attending: Emergency Medicine | Admitting: Emergency Medicine

## 2015-10-26 ENCOUNTER — Encounter (HOSPITAL_COMMUNITY): Payer: Self-pay | Admitting: Emergency Medicine

## 2015-10-26 DIAGNOSIS — M1990 Unspecified osteoarthritis, unspecified site: Secondary | ICD-10-CM | POA: Diagnosis not present

## 2015-10-26 DIAGNOSIS — K029 Dental caries, unspecified: Secondary | ICD-10-CM | POA: Diagnosis not present

## 2015-10-26 DIAGNOSIS — I10 Essential (primary) hypertension: Secondary | ICD-10-CM | POA: Diagnosis not present

## 2015-10-26 DIAGNOSIS — E039 Hypothyroidism, unspecified: Secondary | ICD-10-CM | POA: Insufficient documentation

## 2015-10-26 DIAGNOSIS — Z79899 Other long term (current) drug therapy: Secondary | ICD-10-CM | POA: Diagnosis not present

## 2015-10-26 DIAGNOSIS — K0889 Other specified disorders of teeth and supporting structures: Secondary | ICD-10-CM | POA: Diagnosis not present

## 2015-10-26 MED ORDER — LIDOCAINE HCL (PF) 1 % IJ SOLN
2.1000 mL | Freq: Once | INTRAMUSCULAR | Status: AC
  Administered 2015-10-26: 2.1 mL
  Filled 2015-10-26: qty 5

## 2015-10-26 MED ORDER — CEFTRIAXONE SODIUM 1 G IJ SOLR
1.0000 g | Freq: Once | INTRAMUSCULAR | Status: AC
  Administered 2015-10-26: 1 g via INTRAMUSCULAR
  Filled 2015-10-26: qty 10

## 2015-10-26 MED ORDER — OXYCODONE-ACETAMINOPHEN 5-325 MG PO TABS
1.0000 | ORAL_TABLET | Freq: Once | ORAL | Status: AC
Start: 2015-10-26 — End: 2015-10-26
  Administered 2015-10-26: 1 via ORAL
  Filled 2015-10-26: qty 1

## 2015-10-26 MED ORDER — PROMETHAZINE HCL 12.5 MG PO TABS
12.5000 mg | ORAL_TABLET | Freq: Once | ORAL | Status: AC
  Administered 2015-10-26: 12.5 mg via ORAL
  Filled 2015-10-26: qty 1

## 2015-10-26 NOTE — Discharge Instructions (Signed)
Use amoxil as prescribed. You were treated with Rocephin in the ED. It is important that you see the dentist as soon as possible. Dental Caries Dental caries is tooth decay. This decay can cause a hole in teeth (cavity) that can get bigger and deeper over time. HOME CARE  Brush and floss your teeth. Do this at least two times a day.  Use a fluoride toothpaste.  Use a mouth rinse if told by your dentist or doctor.  Eat less sugary and starchy foods. Drink less sugary drinks.  Avoid snacking often on sugary and starchy foods. Avoid sipping often on sugary drinks.  Keep regular checkups and cleanings with your dentist.  Use fluoride supplements if told by your dentist or doctor.  Allow fluoride to be applied to teeth if told by your dentist or doctor.   This information is not intended to replace advice given to you by your health care provider. Make sure you discuss any questions you have with your health care provider.   Document Released: 03/23/2008 Document Revised: 07/05/2014 Document Reviewed: 06/16/2012 Elsevier Interactive Patient Education [employer].

## 2015-10-26 NOTE — ED Provider Notes (Signed)
CSN: [id number]     Arrival date & time 10/26/15  1057 History   By signing my name below, I, Randa Evens, attest that this documentation has been prepared under the direction and in the presence of Lily Kocher, PA-C. Electronically Signed: Randa Evens, ED Scribe. 10/26/2015. 11:37 AM.    Chief Complaint  Patient presents with  . Dental Pain   Patient is a 65 y.o. female presenting with tooth pain. The history is provided by the patient. No language interpreter was used.  Dental Pain Location:  Lower Severity:  Moderate Duration:  3 days Timing:  Constant Chronicity:  Recurrent Context: dental caries   Relieved by:  Nothing Ineffective treatments: ampicillin. Associated symptoms: facial pain and facial swelling   Associated symptoms: no difficulty swallowing, no fever and no trismus    HPI Comments: Kim Warren is a 65 y.o. female who presents to the Emergency Department complaining of left sided dental pain onset 3 days prior. Pt reports associated HA and facial swelling. Pt states she has tried ampicillin with no relief. Pt states she has also tried percocet with temporary relief. Pt denies trouble swallowing, drooling or other related symptoms. Pt reports that she has a HX of dental abscesses.     Past Medical History  Diagnosis Date  . Hypertension   . Hypothyroidism   . GERD (gastroesophageal reflux disease)   . History of kidney stones   . Arthritis   . Headache(784.0)     sinus headaches-seasonal  . Onychomycosis     LEFT BIG TOE  . Neuromuscular disorder (HCC)     fibromyalgia  . Dysphagia   . Palpitations   . Dysrhythmia     BENIGN PVCS  . H/O hiatal hernia   . Fibromyalgia   . Degenerative joint disease of spine     SEVERE LOW BACK PAIN-DDD, SOMETIMES PAIN IN LOWER LEGS;  PT ALSO HAS DDD CERVICAL - WITH NECK PAIN THAT RADIATES TO BOTH SHOULDERS AND ARMS AND NUMBNESS FINGERS BOTH HANDS.  AS OF 09/18/13 - PT HAS NOTICED WORSENING OF NECK, SHOULDER PAIN  - "SEARING PAIN" & FEELING OF ELECTRICAL SENSATIONS - SHE PLANS TO GET IN TOUCH WITH HER NEUROLOGIST THIS WEEK BEFORE PLANNED NISSEN SURG ON 3/30  . Hoarseness of voice     PT RELATES TO GERD PROBLEM   Past Surgical History  Procedure Laterality Date  . Cholecystectomy    . Periosteal chondroma  1979    left middle finger-benign  . Abdominal hysterectomy  1990    partial  . Dilation and curettage of uterus    . Brain surgery  1995    meningioma-benign  . Parathyroid exploration  2011    tumor off-benign  . Esophagogastroduodenoscopy (egd) with propofol N/A 05/09/2013    Procedure: ESOPHAGOGASTRODUODENOSCOPY (EGD) WITH PROPOFOL;  Surgeon: Arta Silence, MD;  Location: [facility];  Service: Endoscopy;  Laterality: N/A;  . Bravo ph study N/A 05/09/2013    Procedure: BRAVO [place];  Surgeon: Arta Silence, MD;  Location: [facility];  Service: Endoscopy;  Laterality: N/A;  . Colonoscopy  06/08/06  . Wisdom tooth extraction    . Laparoscopic nissen fundoplication N/A [id number]    Procedure: LAPAROSCOPIC NISSEN AND HIATAL HERNIA REPAIR;  Surgeon: Pedro Earls, MD;  Location: [facility];  Service: General;  Laterality: N/A;   Family History  Problem Relation Age of Onset  . Cancer Mother     colon cancer?  . Heart disease Father    Social  History  Substance Use Topics  . Smoking status: Never Smoker   . Smokeless tobacco: Never Used  . Alcohol Use: No   OB History    Gravida Para Term Preterm AB TAB SAB Ectopic Multiple Living   4 4 4       4      Review of Systems  Constitutional: Negative for fever.  HENT: Positive for facial swelling.   All other systems reviewed and are negative.    Allergies  Erythromycin; Morphine and related; Nsaids; Ciprofloxacin; and Flagyl  Home Medications   Prior to Admission medications   Medication Sig Start Date End Date Taking? Authorizing Provider  Biotin 5000 MCG TABS Take 1 tablet by mouth daily. Reported on 10/02/2015     Historical Provider, MD  diclofenac sodium (VOLTAREN) 1 % GEL Apply 2 g topically 4 (four) times daily as needed (Pain). Reported on 10/02/2015    Historical Provider, MD  diphenhydrAMINE (BENADRYL) 25 MG tablet Take 25 mg by mouth every 6 (six) hours as needed.    Historical Provider, MD  diphenoxylate-atropine (LOMOTIL) 2.5-0.025 MG tablet Take by mouth 4 (four) times daily as needed for diarrhea or loose stools.    Historical Provider, MD  fenofibrate 160 MG tablet Take 160 mg by mouth at bedtime.    Historical Provider, MD  hydrochlorothiazide (HYDRODIURIL) 25 MG tablet Take 25 mg by mouth daily as needed (Blood Pressure).     Historical Provider, MD  levothyroxine (SYNTHROID, LEVOTHROID) 100 MCG tablet Take 100 mcg by mouth daily before breakfast.    Historical Provider, MD  lisinopril (PRINIVIL,ZESTRIL) 10 MG tablet Take 10 mg by mouth every morning.    Historical Provider, MD  loratadine (CLARITIN) 10 MG tablet Take 10 mg by mouth every morning.    Historical Provider, MD  methocarbamol (ROBAXIN) 500 MG tablet Take 500 mg by mouth every 6 (six) hours as needed for muscle spasms.    Historical Provider, MD  metoprolol succinate (TOPROL-XL) 50 MG 24 hr tablet Take 50 mg by mouth every morning. Take with or immediately following a meal.    Historical Provider, MD  oxyCODONE-acetaminophen (PERCOCET) 7.5-325 MG tablet Take 1 tablet by mouth every 6 (six) hours as needed for moderate pain.    Historical Provider, MD  Pseudoeph-Doxylamine-DM-APAP (NYQUIL PO) Take by mouth.    Historical Provider, MD  simethicone (MYLICON) 80 MG chewable tablet Chew 180 mg by mouth every 6 (six) hours as needed for flatulence.    Historical Provider, MD  zolpidem (AMBIEN) 10 MG tablet Take 3.33 mg by mouth at bedtime as needed for sleep (Patient usually cuts it into a third).    Historical Provider, MD   BP 143/82 mmHg  Pulse 77  Temp(Src) 98 F (36.7 C) (Oral)  Resp 16  Ht 5' (1.524 m)  Wt 169 lb (76.658 kg)   BMI 33.01 kg/m2  SpO2 99%   Physical Exam  Constitutional: She is oriented to person, place, and time. She appears well-developed and well-nourished. No distress.  HENT:  Head: Normocephalic and atraumatic.  Face is symmetrical,  no lost of labial folds. No temparture changes about the face. Deep cavity of the left lower pre molar, swelling of the gums, no visual abscess, no swellling of the tongue, airway is patent.  Eyes: Conjunctivae and EOM are normal.  Neck: Neck supple. No tracheal deviation present.  Cardiovascular: Normal rate, regular rhythm and normal heart sounds.   Pulmonary/Chest: Effort normal and breath sounds normal. No respiratory distress.  She has no wheezes. She has no rales.  Musculoskeletal: Normal range of motion.  Lymphadenopathy:    She has no cervical adenopathy.  Neurological: She is alert and oriented to person, place, and time.  Skin: Skin is warm and dry.  Psychiatric: She has a normal mood and affect. Her behavior is normal.  Nursing note and vitals reviewed.   ED Course  Procedures (including critical care time) DIAGNOSTIC STUDIES: Oxygen Saturation is 99% on RA, normal by my interpretation.    COORDINATION OF CARE: 11:31 AM-Discussed treatment plan which includes IM rocephin, percocet with pt at bedside and pt agreed to plan.    Labs Review Labs Reviewed - No data to display  Imaging Review No results found.    EKG Interpretation None      MDM  Pt states she is on antibiotics but pain getting worse and she feels there is swelling of the gums and face. Pt treated in the ED with Rocephin. Pt will continue current medications. I have encouraged pt to see her dentist or PCP if not improving.   Final diagnoses:  Dental caries      **I personally performed the services described in this documentation, which was scribed in my presence. The recorded information has been reviewed and is accurate. I have reviewed nursing notes, vital signs,  and all appropriate lab and imaging results for this patient.Lily Kocher, PA-C 10/29/15 [place], MD 10/30/15 [phone]

## 2015-10-26 NOTE — ED Notes (Signed)
Pt made aware to return if symptoms worsen or if any life threatening symptoms occur.   

## 2015-10-26 NOTE — ED Notes (Signed)
Having dental pain since Thursday.  Treated with ampicillin since Friday and pain has increased.  Swelling to left lower jaw.  Rates pain rates 9/10.

## 2015-10-30 DIAGNOSIS — R490 Dysphonia: Secondary | ICD-10-CM | POA: Diagnosis not present

## 2015-10-30 DIAGNOSIS — J385 Laryngeal spasm: Secondary | ICD-10-CM | POA: Diagnosis not present

## 2015-11-14 NOTE — Patient Instructions (Signed)
Kim Warren  11/14/2015     @PREFPERIOPPHARMACY @   Your procedure is scheduled on  11/21/2015   Report to [facility] at  855  A.M.  Call this number if you have problems the morning of surgery:  [phone]   Remember:  Do not eat food or drink liquids after midnight.  Take these medicines the morning of surgery with A SIP OF WATER  fioricet or percocet, levothyroxine, lisinopril, claritin, robaxin, metoprolol, zofran.   Do not wear jewelry, make-up or nail polish.  Do not wear lotions, powders, or perfumes.  You may wear deodorant.  Do not shave 48 hours prior to surgery.  Men may shave face and neck.  Do not bring valuables to the hospital.  [facility] is not responsible for any belongings or valuables.  Contacts, dentures or bridgework may not be worn into surgery.  Leave your suitcase in the car.  After surgery it may be brought to your room.  For patients admitted to the hospital, discharge time will be determined by your treatment team.  Patients discharged the day of surgery will not be allowed to drive home.   Name and phone number of your driver:   family Special instructions:  Follow the diet and prep instructions given to you by Dr Olevia Perches office.  Please read over the following fact sheets that you were given. Coughing and Deep Breathing, Surgical Site Infection Prevention, Anesthesia Post-op Instructions and Care and Recovery After Surgery      Colonoscopy A colonoscopy is an exam to look at the entire large intestine (colon). This exam can help find problems such as tumors, polyps, inflammation, and areas of bleeding. The exam takes about 1 hour.  LET [facility] CARE PROVIDER KNOW ABOUT:   Any allergies you have.  All medicines you are taking, including vitamins, herbs, eye drops, creams, and over-the-counter medicines.  Previous problems you or members of your family have had with the use of anesthetics.  Any blood disorders you  have.  Previous surgeries you have had.  Medical conditions you have. RISKS AND COMPLICATIONS  Generally, this is a safe procedure. However, as with any procedure, complications can occur. Possible complications include:  Bleeding.  Tearing or rupture of the colon wall.  Reaction to medicines given during the exam.  Infection (rare). BEFORE THE PROCEDURE   Ask your health care provider about changing or stopping your regular medicines.  You may be prescribed an oral bowel prep. This involves drinking a large amount of medicated liquid, starting the day before your procedure. The liquid will cause you to have multiple loose stools until your stool is almost clear or light green. This cleans out your colon in preparation for the procedure.  Do not eat or drink anything else once you have started the bowel prep, unless your health care provider tells you it is safe to do so.  Arrange for someone to drive you home after the procedure. PROCEDURE   You will be given medicine to help you relax (sedative).  You will lie on your side with your knees bent.  A long, flexible tube with a light and camera on the end (colonoscope) will be inserted through the rectum and into the colon. The camera sends video back to a computer screen as it moves through the colon. The colonoscope also releases carbon dioxide gas to inflate the colon. This helps your health care provider see the area better.  During the exam, your health care provider may take a small tissue sample (biopsy) to be examined under a microscope if any abnormalities are found.  The exam is finished when the entire colon has been viewed. AFTER THE PROCEDURE   Do not drive for 24 hours after the exam.  You may have a small amount of blood in your stool.  You may pass moderate amounts of gas and have mild abdominal cramping or bloating. This is caused by the gas used to inflate your colon during the exam.  Ask when your test  results will be ready and how you will get your results. Make sure you get your test results.   This information is not intended to replace advice given to you by your health care provider. Make sure you discuss any questions you have with your health care provider.   Document Released: 06/11/2000 Document Revised: 04/04/2013 Document Reviewed: 02/19/2013 Elsevier Interactive Patient Education 2016 [employer] Colonoscopy, Care After Refer to this sheet in the next few weeks. These instructions provide you with information on caring for yourself after your procedure. Your health care provider may also give you more specific instructions. Your treatment has been planned according to current medical practices, but problems sometimes occur. Call your health care provider if you have any problems or questions after your procedure. WHAT TO EXPECT AFTER THE PROCEDURE  After your procedure, it is typical to have the following:  A small amount of blood in your stool.  Moderate amounts of gas and mild abdominal cramping or bloating. HOME CARE INSTRUCTIONS  Do not drive, operate machinery, or sign important documents for 24 hours.  You may shower and resume your regular physical activities, but move at a slower pace for the first 24 hours.  Take frequent rest periods for the first 24 hours.  Walk around or put a warm pack on your abdomen to help reduce abdominal cramping and bloating.  Drink enough fluids to keep your urine clear or pale yellow.  You may resume your normal diet as instructed by your health care provider. Avoid heavy or fried foods that are hard to digest.  Avoid drinking alcohol for 24 hours or as instructed by your health care provider.  Only take over-the-counter or prescription medicines as directed by your health care provider.  If a tissue sample (biopsy) was taken during your procedure:  Do not take aspirin or blood thinners for 7 days, or as instructed by your  health care provider.  Do not drink alcohol for 7 days, or as instructed by your health care provider.  Eat soft foods for the first 24 hours. SEEK MEDICAL CARE IF: You have persistent spotting of blood in your stool 2-3 days after the procedure. SEEK IMMEDIATE MEDICAL CARE IF:  You have more than a small spotting of blood in your stool.  You pass large blood clots in your stool.  Your abdomen is swollen (distended).  You have nausea or vomiting.  You have a fever.  You have increasing abdominal pain that is not relieved with medicine.   This information is not intended to replace advice given to you by your health care provider. Make sure you discuss any questions you have with your health care provider.   Document Released: 01/27/2004 Document Revised: 04/04/2013 Document Reviewed: 02/19/2013 Elsevier Interactive Patient Education 2016 [employer] PATIENT INSTRUCTIONS POST-ANESTHESIA  IMMEDIATELY FOLLOWING SURGERY:  Do not drive or operate machinery for the first twenty four hours after surgery.  Do  not make any important decisions for twenty four hours after surgery or while taking narcotic pain medications or sedatives.  If you develop intractable nausea and vomiting or a severe headache please notify your doctor immediately.  FOLLOW-UP:  Please make an appointment with your surgeon as instructed. You do not need to follow up with anesthesia unless specifically instructed to do so.  WOUND CARE INSTRUCTIONS (if applicable):  Keep a dry clean dressing on the anesthesia/puncture wound site if there is drainage.  Once the wound has quit draining you may leave it open to air.  Generally you should leave the bandage intact for twenty four hours unless there is drainage.  If the epidural site drains for more than 36-48 hours please call the anesthesia department.  QUESTIONS?:  Please feel free to call your physician or the hospital operator if you have any questions, and they will  be happy to assist you.

## 2015-11-17 ENCOUNTER — Other Ambulatory Visit (HOSPITAL_COMMUNITY): Payer: PPO

## 2015-11-17 DIAGNOSIS — L301 Dyshidrosis [pompholyx]: Secondary | ICD-10-CM | POA: Diagnosis not present

## 2015-11-17 DIAGNOSIS — Z1283 Encounter for screening for malignant neoplasm of skin: Secondary | ICD-10-CM | POA: Diagnosis not present

## 2015-11-18 ENCOUNTER — Encounter (HOSPITAL_COMMUNITY)
Admission: RE | Admit: 2015-11-18 | Discharge: 2015-11-18 | Disposition: A | Discharge status: Home / Self Care | Payer: PPO | Source: Ambulatory Visit | Attending: Internal Medicine | Admitting: Internal Medicine

## 2015-11-18 ENCOUNTER — Encounter (HOSPITAL_COMMUNITY): Payer: Self-pay

## 2015-11-18 ENCOUNTER — Other Ambulatory Visit: Payer: Self-pay

## 2015-11-18 VITALS — BP 154/74 | HR 62 | Temp 97.9°F | Resp 16 | Ht 60.0 in | Wt 167.0 lb

## 2015-11-18 DIAGNOSIS — Z01812 Encounter for preprocedural laboratory examination: Secondary | ICD-10-CM | POA: Diagnosis not present

## 2015-11-18 DIAGNOSIS — Z01810 Encounter for preprocedural cardiovascular examination: Secondary | ICD-10-CM | POA: Insufficient documentation

## 2015-11-18 DIAGNOSIS — Z1211 Encounter for screening for malignant neoplasm of colon: Secondary | ICD-10-CM

## 2015-11-18 LAB — BASIC METABOLIC PANEL
Anion gap: 8 (ref 5–15)
BUN: 20 mg/dL (ref 6–20)
CALCIUM: 9.7 mg/dL (ref 8.9–10.3)
CO2: 24 mmol/L (ref 22–32)
CREATININE: 0.93 mg/dL (ref 0.44–1.00)
Chloride: 104 mmol/L (ref 101–111)
Glucose, Bld: 89 mg/dL (ref 65–99)
Potassium: 4 mmol/L (ref 3.5–5.1)
SODIUM: 136 mmol/L (ref 135–145)

## 2015-11-18 LAB — CBC
HCT: 40.9 % (ref 36.0–46.0)
Hemoglobin: 13.8 g/dL (ref 12.0–15.0)
MCH: 26.8 pg (ref 26.0–34.0)
MCHC: 33.7 g/dL (ref 30.0–36.0)
MCV: 79.4 fL (ref 78.0–100.0)
PLATELETS: 287 10*3/uL (ref 150–400)
RBC: 5.15 MIL/uL — ABNORMAL HIGH (ref 3.87–5.11)
RDW: 14.5 % (ref 11.5–15.5)
WBC: 9.1 10*3/uL (ref 4.0–10.5)

## 2015-11-19 ENCOUNTER — Other Ambulatory Visit (INDEPENDENT_AMBULATORY_CARE_PROVIDER_SITE_OTHER): Payer: Self-pay | Admitting: *Deleted

## 2015-11-19 ENCOUNTER — Encounter (INDEPENDENT_AMBULATORY_CARE_PROVIDER_SITE_OTHER): Payer: Self-pay | Admitting: *Deleted

## 2015-11-19 MED ORDER — SOD PHOS MONO-SOD PHOS DIBASIC 1.102-0.398 G PO TABS: 1.0000 | ORAL_TABLET | Freq: Once | ORAL | Status: DC

## 2015-11-19 NOTE — Telephone Encounter (Signed)
Patient needs osmo prep 

## 2015-11-21 ENCOUNTER — Other Ambulatory Visit (INDEPENDENT_AMBULATORY_CARE_PROVIDER_SITE_OTHER): Payer: Self-pay | Admitting: *Deleted

## 2015-11-21 NOTE — Telephone Encounter (Signed)
Patient needs osmo pill prep 

## 2015-11-26 MED ORDER — SOD PHOS MONO-SOD PHOS DIBASIC 1.102-0.398 G PO TABS: 1.0000 | ORAL_TABLET | Freq: Once | ORAL | Status: AC

## 2015-12-01 DIAGNOSIS — M25519 Pain in unspecified shoulder: Secondary | ICD-10-CM | POA: Diagnosis not present

## 2015-12-01 DIAGNOSIS — R252 Cramp and spasm: Secondary | ICD-10-CM | POA: Diagnosis not present

## 2015-12-01 DIAGNOSIS — M1990 Unspecified osteoarthritis, unspecified site: Secondary | ICD-10-CM | POA: Diagnosis not present

## 2015-12-01 DIAGNOSIS — G4459 Other complicated headache syndrome: Secondary | ICD-10-CM | POA: Diagnosis not present

## 2015-12-01 DIAGNOSIS — M519 Unspecified thoracic, thoracolumbar and lumbosacral intervertebral disc disorder: Secondary | ICD-10-CM | POA: Diagnosis not present

## 2015-12-01 DIAGNOSIS — M797 Fibromyalgia: Secondary | ICD-10-CM | POA: Diagnosis not present

## 2015-12-01 DIAGNOSIS — F5109 Other insomnia not due to a substance or known physiological condition: Secondary | ICD-10-CM | POA: Diagnosis not present

## 2015-12-01 DIAGNOSIS — E119 Type 2 diabetes mellitus without complications: Secondary | ICD-10-CM | POA: Diagnosis not present

## 2015-12-01 DIAGNOSIS — M545 Low back pain: Secondary | ICD-10-CM | POA: Diagnosis not present

## 2015-12-01 DIAGNOSIS — I10 Essential (primary) hypertension: Secondary | ICD-10-CM | POA: Diagnosis not present

## 2015-12-01 DIAGNOSIS — Z79899 Other long term (current) drug therapy: Secondary | ICD-10-CM | POA: Diagnosis not present

## 2015-12-01 DIAGNOSIS — H81393 Other peripheral vertigo, bilateral: Secondary | ICD-10-CM | POA: Diagnosis not present

## 2015-12-02 ENCOUNTER — Encounter (INDEPENDENT_AMBULATORY_CARE_PROVIDER_SITE_OTHER): Payer: Self-pay | Admitting: *Deleted

## 2015-12-02 DIAGNOSIS — J385 Laryngeal spasm: Secondary | ICD-10-CM | POA: Diagnosis not present

## 2015-12-02 DIAGNOSIS — R490 Dysphonia: Secondary | ICD-10-CM | POA: Diagnosis not present

## 2015-12-03 DIAGNOSIS — E039 Hypothyroidism, unspecified: Secondary | ICD-10-CM | POA: Diagnosis not present

## 2015-12-03 DIAGNOSIS — E049 Nontoxic goiter, unspecified: Secondary | ICD-10-CM | POA: Diagnosis not present

## 2015-12-11 ENCOUNTER — Encounter: Payer: Self-pay | Admitting: Orthopaedic Surgery

## 2015-12-11 ENCOUNTER — Ambulatory Visit (INDEPENDENT_AMBULATORY_CARE_PROVIDER_SITE_OTHER): Payer: PPO | Admitting: Orthopaedic Surgery

## 2015-12-11 ENCOUNTER — Encounter (INDEPENDENT_AMBULATORY_CARE_PROVIDER_SITE_OTHER): Payer: Self-pay | Admitting: *Deleted

## 2015-12-11 ENCOUNTER — Ambulatory Visit (INDEPENDENT_AMBULATORY_CARE_PROVIDER_SITE_OTHER): Payer: PPO

## 2015-12-11 VITALS — BP 132/79 | HR 63 | Ht 60.0 in | Wt 166.0 lb

## 2015-12-11 DIAGNOSIS — M25512 Pain in left shoulder: Secondary | ICD-10-CM | POA: Diagnosis not present

## 2015-12-11 DIAGNOSIS — M25511 Pain in right shoulder: Secondary | ICD-10-CM

## 2015-12-11 DIAGNOSIS — I10 Essential (primary) hypertension: Secondary | ICD-10-CM

## 2015-12-11 NOTE — Telephone Encounter (Signed)
This encounter was created in error - please disregard.

## 2015-12-11 NOTE — Patient Instructions (Addendum)
Continue current medications.  Can not take NSAIDS due to Hx: kidney failure. Uses Voltaren gel Return after MRI.rt Shoulder.Marland Kitchen

## 2015-12-11 NOTE — Progress Notes (Signed)
Subjective: Both of my shoulders hurt    Patient ID: Kim Warren, female    DOB: 06-16-1951, 65 y.o.   MRN: [id number]  HPI She has a long history of bilateral shoulder pain, more on the right than the left.  She has pain with overhead use and has begun to lose the ability to raise her hands over her head because of shoulder pain. She has no trauma, no redness, no paresthesias.  She has tried ice, heat, rest, rubs, exercises, NSAIDs with little help.  She feels she has rotator cuff problem.  She has been seen at [facility] multiple times for the shoulder pain.  I have notes of 10-15-15 from [place] documenting this.  She has been hospitalized this year for diverticulitis with three episodes since January.  She has to watch her diet closely.  She has type 2 diabetes which she is controlled.  She has had thyroid problems and been seen by endocrinologist for this and the diabetes.  She is still under careful care for the thyroid problem.  She cannot take NSAIDs now secondary to renal failure.  Review of Systems  HENT: Negative for congestion.   Respiratory: Negative for cough and shortness of breath.   Cardiovascular: Positive for palpitations. Negative for chest pain and leg swelling.  Endocrine: Positive for cold intolerance.  Musculoskeletal: Positive for joint swelling and arthralgias.  Allergic/Immunologic: Positive for environmental allergies.       Past Medical History  Diagnosis Date  . Hypertension   . Hypothyroidism   . GERD (gastroesophageal reflux disease)   . History of kidney stones   . Arthritis   . Headache(784.0)     sinus headaches-seasonal  . Onychomycosis     LEFT BIG TOE  . Neuromuscular disorder (HCC)     fibromyalgia  . Dysphagia   . Palpitations   . Dysrhythmia     BENIGN PVCS  . H/O hiatal hernia   . Fibromyalgia   . Degenerative joint disease of spine     SEVERE LOW BACK PAIN-DDD, SOMETIMES PAIN IN LOWER LEGS;  PT ALSO HAS DDD CERVICAL - WITH  NECK PAIN THAT RADIATES TO BOTH SHOULDERS AND ARMS AND NUMBNESS FINGERS BOTH HANDS.  AS OF 09/18/13 - PT HAS NOTICED WORSENING OF NECK, SHOULDER PAIN - "SEARING PAIN" & FEELING OF ELECTRICAL SENSATIONS - SHE PLANS TO GET IN TOUCH WITH HER NEUROLOGIST THIS WEEK BEFORE PLANNED NISSEN SURG ON 3/30  . Hoarseness of voice     PT RELATES TO GERD PROBLEM    Past Surgical History  Procedure Laterality Date  . Cholecystectomy    . Periosteal chondroma  1979    left middle finger-benign  . Abdominal hysterectomy  1990    partial  . Dilation and curettage of uterus    . Brain surgery  1995    meningioma-benign  . Parathyroid exploration  2011    tumor off-benign  . Esophagogastroduodenoscopy (egd) with propofol N/A 05/09/2013    Procedure: ESOPHAGOGASTRODUODENOSCOPY (EGD) WITH PROPOFOL;  Surgeon: Arta Silence, MD;  [facility];  Service: Endoscopy;  Laterality: N/A;  . Bravo ph study N/A 05/09/2013    Procedure: BRAVO [place];  Surgeon: Arta Silence, MD;  [facility];  Service: Endoscopy;  Laterality: N/A;  . Colonoscopy  06/08/06  . Wisdom tooth extraction    . Laparoscopic nissen fundoplication N/A [id number]    Procedure: LAPAROSCOPIC NISSEN AND HIATAL HERNIA REPAIR;  Surgeon: Pedro Earls, MD;  Location: [facility];  Service: General;  Laterality: N/A;    Current Outpatient Prescriptions on File Prior to Visit  Medication Sig Dispense Refill  . Biotin 5000 MCG TABS Take 1 tablet by mouth daily. Reported on 10/02/2015    . butalbital-acetaminophen-caffeine (FIORICET, ESGIC) 50-325-40 MG tablet Take 1 tablet by mouth 2 (two) times daily as needed for headache.    . diclofenac sodium (VOLTAREN) 1 % GEL Apply 2 g topically 4 (four) times daily as needed (Pain). Reported on 10/02/2015    . diphenhydrAMINE (BENADRYL) 25 MG tablet Take 25 mg by mouth every 6 (six) hours as needed.    . diphenoxylate-atropine (LOMOTIL) 2.5-0.025 MG tablet Take by mouth 4 (four) times daily as  needed for diarrhea or loose stools.    . fenofibrate 160 MG tablet Take 160 mg by mouth at bedtime.    . hydrochlorothiazide (HYDRODIURIL) 25 MG tablet Take 25 mg by mouth daily as needed (Blood Pressure).     Marland Kitchen levothyroxine (SYNTHROID, LEVOTHROID) 100 MCG tablet Take 1,120 mcg by mouth daily before breakfast.     . lisinopril (PRINIVIL,ZESTRIL) 10 MG tablet Take 10 mg by mouth every morning.    . loratadine (CLARITIN) 10 MG tablet Take 10 mg by mouth every morning.    . meclizine (ANTIVERT) 25 MG tablet Take 25 mg by mouth 3 (three) times daily as needed for dizziness.    . methocarbamol (ROBAXIN) 500 MG tablet Take 500 mg by mouth 3 (three) times daily.     . metoprolol succinate (TOPROL-XL) 50 MG 24 hr tablet Take 50 mg by mouth every morning. Take with or immediately following a meal.    . ondansetron (ZOFRAN) 4 MG tablet Take 4 mg by mouth every 8 (eight) hours as needed for nausea or vomiting.    Marland Kitchen oxyCODONE-acetaminophen (PERCOCET) 7.5-325 MG tablet Take 1 tablet by mouth every 6 (six) hours as needed for moderate pain.    . pseudoephedrine (SUDAFED) 30 MG tablet Take 60 mg by mouth daily as needed (allergies).    . simethicone (MYLICON) 80 MG chewable tablet Chew 180 mg by mouth every 6 (six) hours as needed for flatulence.    . sodium phosphates (OSMOPREP) 1.102-0.398 g TABS tablet Take 1 tablet by mouth once. 32 tablet 0  . zolpidem (AMBIEN) 10 MG tablet Take 3.33 mg by mouth at bedtime as needed for sleep (Patient usually cuts it into a third).     No current facility-administered medications on file prior to visit.    Social History   Social History  . Marital Status: Married    Spouse Name: N/A  . Number of Children: N/A  . Years of Education: N/A   Occupational History  . Not on file.   Social History Main Topics  . Smoking status: Never Smoker   . Smokeless tobacco: Never Used  . Alcohol Use: No  . Drug Use: No  . Sexual Activity: Not on file   Other Topics  Concern  . Not on file   Social History Narrative    BP 132/79 mmHg  Pulse 63  Ht 5' (1.524 m)  Wt 166 lb (75.297 kg)  BMI 32.42 kg/m2  Objective:   Physical Exam  Constitutional: She is oriented to person, place, and time. She appears well-developed and well-nourished.  HENT:  Head: Normocephalic and atraumatic.  Eyes: Conjunctivae and EOM are normal. Pupils are equal, round, and reactive to light.  Neck: Normal range of motion. Neck supple.  Cardiovascular: Normal rate, regular rhythm  and intact distal pulses.   Pulmonary/Chest: Effort normal.  Abdominal: Soft.  Musculoskeletal: She exhibits tenderness (Both shoulders tender.  See separate area below on exam.  NV intact.).  Neurological: She is alert and oriented to person, place, and time. She displays normal reflexes. No cranial nerve deficit. She exhibits normal muscle tone. Coordination normal.  Skin: Skin is warm and dry.  Psychiatric: She has a normal mood and affect. Her behavior is normal. Judgment and thought content normal.   Examination of right Upper Extremity is done.  Inspection:   Overall:  Elbow non-tender without crepitus or defects, forearm non-tender without crepitus or defects, wrist non-tender without crepitus or defects, hand non-tender.    Shoulder: with glenohumeral joint tenderness, without effusion.   Upper arm: without swelling and tenderness   Range of motion:   Overall:  Full range of motion of the elbow, full range of motion of wrist and full range of motion in fingers.   Shoulder:  right  145 degrees forward flexion; 100 degrees abduction; 30 degrees internal rotation, 30 degrees external rotation, 15 degrees extension, 35 degrees adduction.   Stability:   Overall:  Shoulder, elbow and wrist stable   Strength and Tone:   Overall full shoulder muscles strength, full upper arm strength and normal upper arm bulk and tone.  Examination of left Upper Extremity is done.  Inspection:   Overall:   Elbow non-tender without crepitus or defects, forearm non-tender without crepitus or defects, wrist non-tender without crepitus or defects, hand non-tender.    Shoulder: with glenohumeral joint tenderness, without effusion.   Upper arm: without swelling and tenderness   Range of motion:   Overall:  Full range of motion of the elbow, full range of motion of wrist and full range of motion in fingers.   Shoulder:  left  160 degrees forward flexion; 120 degrees abduction; 30 degrees internal rotation, 30 degrees external rotation, 15 degrees extension, 40 degrees adduction.   Stability:   Overall:  Shoulder, elbow and wrist stable   Strength and Tone:   Overall full shoulder muscles strength, full upper arm strength and normal upper arm bulk and tone.  X-rays were done of both shoulders reported separately.  Assessment & Plan:   Encounter Diagnoses  Name Primary?  . Right shoulder pain Yes  . Left shoulder pain   . Essential hypertension    I would like to get MRI of the right shoulder as she has not improved over long treatment.  I am concerned about rotator cuff tear.  Call if any problem.  Precautions given.  Return after MRI of the right shoulder.  Electronically Signed Sanjuana Kava, MD 6/15/201710:57 PM

## 2015-12-15 ENCOUNTER — Other Ambulatory Visit (HOSPITAL_COMMUNITY): Payer: PPO

## 2015-12-22 ENCOUNTER — Ambulatory Visit (HOSPITAL_COMMUNITY): Admission: RE | Admit: 2015-12-22 | Payer: PPO | Source: Ambulatory Visit

## 2015-12-24 ENCOUNTER — Ambulatory Visit: Payer: PPO | Admitting: Orthopaedic Surgery

## 2015-12-26 ENCOUNTER — Ambulatory Visit (HOSPITAL_COMMUNITY): Payer: PPO

## 2015-12-31 ENCOUNTER — Telehealth: Payer: Self-pay | Admitting: Orthopaedic Surgery

## 2015-12-31 ENCOUNTER — Ambulatory Visit: Payer: PPO | Admitting: Orthopaedic Surgery

## 2015-12-31 NOTE — Telephone Encounter (Signed)
Noted  

## 2015-12-31 NOTE — Telephone Encounter (Signed)
Patient relayed regarding MRI appointment, which was scheduled 12/26/15 - states had taken a taxi to [facility] for this appointment and when she arrived, states was told the "machine was down".  States they called her only once, and hadn't reached her; therefore, no MRI has been done as of yet, due to patient's other extenuating circumstances (dog is very ill, air-conditioning out).  I apologized and patient was very understanding.  States she will get the MRI re-scheduled as soon as possible and will call back for the follow up appointment for results.

## 2016-01-09 ENCOUNTER — Ambulatory Visit (HOSPITAL_COMMUNITY)
Admission: RE | Admit: 2016-01-09 | Discharge: 2016-01-09 | Disposition: A | Discharge status: Home / Self Care | Payer: PPO | Source: Ambulatory Visit | Attending: Orthopaedic Surgery | Admitting: Orthopaedic Surgery

## 2016-01-09 DIAGNOSIS — M24011 Loose body in right shoulder: Secondary | ICD-10-CM | POA: Diagnosis not present

## 2016-01-09 DIAGNOSIS — M19011 Primary osteoarthritis, right shoulder: Secondary | ICD-10-CM | POA: Insufficient documentation

## 2016-01-09 DIAGNOSIS — M25411 Effusion, right shoulder: Secondary | ICD-10-CM | POA: Diagnosis not present

## 2016-01-09 DIAGNOSIS — M25511 Pain in right shoulder: Secondary | ICD-10-CM | POA: Insufficient documentation

## 2016-01-19 ENCOUNTER — Inpatient Hospital Stay (HOSPITAL_COMMUNITY): Admission: RE | Admit: 2016-01-19 | Payer: PPO | Source: Ambulatory Visit

## 2016-01-21 ENCOUNTER — Encounter: Payer: Self-pay | Admitting: Orthopaedic Surgery

## 2016-01-21 ENCOUNTER — Ambulatory Visit (INDEPENDENT_AMBULATORY_CARE_PROVIDER_SITE_OTHER): Payer: PPO | Admitting: Orthopaedic Surgery

## 2016-01-21 VITALS — BP 128/74 | HR 60 | Temp 97.7°F | Ht 60.0 in | Wt 164.4 lb

## 2016-01-21 DIAGNOSIS — M19011 Primary osteoarthritis, right shoulder: Secondary | ICD-10-CM | POA: Diagnosis not present

## 2016-01-21 DIAGNOSIS — I10 Essential (primary) hypertension: Secondary | ICD-10-CM | POA: Diagnosis not present

## 2016-01-21 DIAGNOSIS — E039 Hypothyroidism, unspecified: Secondary | ICD-10-CM | POA: Diagnosis not present

## 2016-01-21 NOTE — Patient Instructions (Signed)
Return as needed

## 2016-01-21 NOTE — Progress Notes (Signed)
Patient [id number] Kim Warren, female DOB:[birth year], 65 y.o. [id number]  Chief Complaint  Patient presents with  . Shoulder Pain    Right shoulder pain    HPI  Kim Warren is a 65 y.o. female who has pain of the right shoulder that is not getting any better.  She has pain most of the time and with most any motion.  She has no paresthesias and no new trauma.  MRI of the right shoulder shows: IMPRESSION: 1. Severe osteoarthritis of the glenohumeral joint with a joint effusion and loose bodies in the joint. 2. Intact rotator cuff.  I have explained the findings to her.  I would recommend a total shoulder replacement.  I do not do surgery anymore, but I can arrange her to have this done.  It is an elective procedure.  She wants to think about it some first.  She will call when she is ready to see someone.  HPI  Body mass index is 32.11 kg/m.  ROS  Review of Systems  HENT: Negative for congestion.   Respiratory: Negative for cough and shortness of breath.   Cardiovascular: Positive for palpitations. Negative for chest pain and leg swelling.  Endocrine: Positive for cold intolerance.  Musculoskeletal: Positive for arthralgias and joint swelling.  Allergic/Immunologic: Positive for environmental allergies.    Past Medical History:  Diagnosis Date  . Arthritis   . Degenerative joint disease of spine    SEVERE LOW BACK PAIN-DDD, SOMETIMES PAIN IN LOWER LEGS;  PT ALSO HAS DDD CERVICAL - WITH NECK PAIN THAT RADIATES TO BOTH SHOULDERS AND ARMS AND NUMBNESS FINGERS BOTH HANDS.  AS OF 09/18/13 - PT HAS NOTICED WORSENING OF NECK, SHOULDER PAIN - "SEARING PAIN" & FEELING OF ELECTRICAL SENSATIONS - SHE PLANS TO GET IN TOUCH WITH HER NEUROLOGIST THIS WEEK BEFORE PLANNED NISSEN SURG ON 3/30  . Dysphagia   . Dysrhythmia    BENIGN PVCS  . Fibromyalgia   . GERD (gastroesophageal reflux disease)   . H/O hiatal hernia   . Headache(784.0)    sinus headaches-seasonal  . History of kidney stones    . Hoarseness of voice    PT RELATES TO GERD PROBLEM  . Hypertension   . Hypothyroidism   . Neuromuscular disorder (HCC)    fibromyalgia  . Onychomycosis    LEFT BIG TOE  . Palpitations     Past Surgical History:  Procedure Laterality Date  . ABDOMINAL HYSTERECTOMY  1990   partial  . BRAIN SURGERY  1995   meningioma-benign  . BRAVO [place] STUDY N/A 05/09/2013   Procedure: BRAVO [place];  Surgeon: Arta Silence, MD;  [facility];  Service: Endoscopy;  Laterality: N/A;  . CHOLECYSTECTOMY    . COLONOSCOPY  06/08/06  . DILATION AND CURETTAGE OF UTERUS    . ESOPHAGOGASTRODUODENOSCOPY (EGD) WITH PROPOFOL N/A 05/09/2013   Procedure: ESOPHAGOGASTRODUODENOSCOPY (EGD) WITH PROPOFOL;  Surgeon: Arta Silence, MD;  [facility];  Service: Endoscopy;  Laterality: N/A;  . LAPAROSCOPIC NISSEN FUNDOPLICATION N/A [id number]   Procedure: LAPAROSCOPIC NISSEN AND HIATAL HERNIA REPAIR;  Surgeon: Pedro Earls, MD;  [facility];  Service: General;  Laterality: N/A;  . PARATHYROID EXPLORATION  2011   tumor off-benign  . periosteal chondroma  1979   left middle finger-benign  . WISDOM TOOTH EXTRACTION      Family History  Problem Relation Age of Onset  . Cancer Mother     colon cancer?  . Heart disease Father  Social History Social History  Substance Use Topics  . Smoking status: Never Smoker  . Smokeless tobacco: Never Used  . Alcohol use No    Allergies  Allergen Reactions  . Erythromycin Other (See Comments)    Upsets stomach  . Morphine And Related Other (See Comments)    HALLUCINATIONS  . Nsaids Other (See Comments)    Kidney Failure  . Ciprofloxacin Rash  . Flagyl [Metronidazole] Rash    Current Outpatient Prescriptions  Medication Sig Dispense Refill  . amoxicillin (AMOXIL) 500 MG capsule     . Biotin 5000 MCG TABS Take 1 tablet by mouth daily. Reported on 10/02/2015    . butalbital-acetaminophen-caffeine (FIORICET, ESGIC) 50-325-40 MG tablet  Take 1 tablet by mouth 2 (two) times daily as needed for headache.    . diclofenac sodium (VOLTAREN) 1 % GEL Apply 2 g topically 4 (four) times daily as needed (Pain). Reported on 10/02/2015    . diphenhydrAMINE (BENADRYL) 25 MG tablet Take 25 mg by mouth every 6 (six) hours as needed.    . diphenoxylate-atropine (LOMOTIL) 2.5-0.025 MG tablet Take by mouth 4 (four) times daily as needed for diarrhea or loose stools.    . fenofibrate 160 MG tablet Take 160 mg by mouth at bedtime.    . hydrochlorothiazide (HYDRODIURIL) 25 MG tablet Take 25 mg by mouth daily as needed (Blood Pressure).     Marland Kitchen levothyroxine (SYNTHROID, LEVOTHROID) 100 MCG tablet Take 1,120 mcg by mouth daily before breakfast.     . lisinopril (PRINIVIL,ZESTRIL) 10 MG tablet Take 10 mg by mouth every morning.    . loratadine (CLARITIN) 10 MG tablet Take 10 mg by mouth every morning.    . meclizine (ANTIVERT) 25 MG tablet Take 25 mg by mouth 3 (three) times daily as needed for dizziness.    . methocarbamol (ROBAXIN) 500 MG tablet Take 500 mg by mouth 3 (three) times daily.     . metoprolol succinate (TOPROL-XL) 50 MG 24 hr tablet Take 50 mg by mouth every morning. Take with or immediately following a meal.    . ondansetron (ZOFRAN) 4 MG tablet Take 4 mg by mouth every 8 (eight) hours as needed for nausea or vomiting.    Marland Kitchen oxyCODONE-acetaminophen (PERCOCET) 7.5-325 MG tablet Take 1 tablet by mouth every 6 (six) hours as needed for moderate pain.    . pseudoephedrine (SUDAFED) 30 MG tablet Take 60 mg by mouth daily as needed (allergies).    . simethicone (MYLICON) 80 MG chewable tablet Chew 180 mg by mouth every 6 (six) hours as needed for flatulence.    . sodium phosphates (OSMOPREP) 1.102-0.398 g TABS tablet Take 1 tablet by mouth once. 32 tablet 0  . zolpidem (AMBIEN) 10 MG tablet Take 3.33 mg by mouth at bedtime as needed for sleep (Patient usually cuts it into a third).     No current facility-administered medications for this visit.       Physical Exam  Blood pressure 128/74, pulse 60, temperature 97.7 F (36.5 C), height 5' (1.524 m), weight 164 lb 6.4 oz (74.6 kg).  Constitutional: overall normal hygiene, normal nutrition, well developed, normal grooming, normal body habitus. Assistive device:none  Musculoskeletal: gait and station Limp none, muscle tone and strength are normal, no tremors or atrophy is present.  .  Neurological: coordination overall normal.  Deep tendon reflex/nerve stretch intact.  Sensation normal.  Cranial nerves II-XII intact.   Skin:   normal overall no scars, lesions, ulcers or rashes. No psoriasis.  Psychiatric: Alert and oriented x 3.  Recent memory intact, remote memory unclear.  Normal mood and affect. Well groomed.  Good eye contact.  Cardiovascular: overall no swelling, no varicosities, no edema bilaterally, normal temperatures of the legs and arms, no clubbing, cyanosis and good capillary refill.  Lymphatic: palpation is normal.  Her shoulder motion on the right is limited and very painful.  NV is intact.  The patient has been educated about the nature of the problem(s) and counseled on treatment options.  The patient appeared to understand what I have discussed and is in agreement with it.  Encounter Diagnoses  Name Primary?  . Primary osteoarthritis of right shoulder Yes  . Essential hypertension     PLAN Call if any problems.  Precautions discussed.  Continue current medications.   Return to clinic prn.  To call when she decides she wants a total shoulder   Electronically Signed Sanjuana Kava, MD 7/26/20174:28 PM

## 2016-01-23 ENCOUNTER — Encounter (HOSPITAL_COMMUNITY): Admission: RE | Payer: Self-pay | Source: Ambulatory Visit

## 2016-01-23 ENCOUNTER — Ambulatory Visit (HOSPITAL_COMMUNITY): Admission: RE | Admit: 2016-01-23 | Payer: PPO | Source: Ambulatory Visit | Admitting: Internal Medicine

## 2016-01-23 SURGERY — COLONOSCOPY WITH PROPOFOL
Anesthesia: Monitor Anesthesia Care

## 2016-02-03 ENCOUNTER — Other Ambulatory Visit (HOSPITAL_COMMUNITY): Payer: Self-pay | Admitting: Endocrinology

## 2016-02-03 DIAGNOSIS — E049 Nontoxic goiter, unspecified: Secondary | ICD-10-CM

## 2016-02-05 DIAGNOSIS — K219 Gastro-esophageal reflux disease without esophagitis: Secondary | ICD-10-CM | POA: Diagnosis not present

## 2016-02-05 DIAGNOSIS — M797 Fibromyalgia: Secondary | ICD-10-CM | POA: Diagnosis not present

## 2016-02-05 DIAGNOSIS — E119 Type 2 diabetes mellitus without complications: Secondary | ICD-10-CM | POA: Diagnosis not present

## 2016-02-05 DIAGNOSIS — M1990 Unspecified osteoarthritis, unspecified site: Secondary | ICD-10-CM | POA: Diagnosis not present

## 2016-02-05 DIAGNOSIS — M25519 Pain in unspecified shoulder: Secondary | ICD-10-CM | POA: Diagnosis not present

## 2016-02-05 DIAGNOSIS — M545 Low back pain: Secondary | ICD-10-CM | POA: Diagnosis not present

## 2016-02-05 DIAGNOSIS — Z79891 Long term (current) use of opiate analgesic: Secondary | ICD-10-CM | POA: Diagnosis not present

## 2016-02-05 DIAGNOSIS — H81393 Other peripheral vertigo, bilateral: Secondary | ICD-10-CM | POA: Diagnosis not present

## 2016-02-05 DIAGNOSIS — F5109 Other insomnia not due to a substance or known physiological condition: Secondary | ICD-10-CM | POA: Diagnosis not present

## 2016-02-05 DIAGNOSIS — M519 Unspecified thoracic, thoracolumbar and lumbosacral intervertebral disc disorder: Secondary | ICD-10-CM | POA: Diagnosis not present

## 2016-02-05 DIAGNOSIS — I10 Essential (primary) hypertension: Secondary | ICD-10-CM | POA: Diagnosis not present

## 2016-02-05 DIAGNOSIS — E039 Hypothyroidism, unspecified: Secondary | ICD-10-CM | POA: Diagnosis not present

## 2016-02-12 ENCOUNTER — Ambulatory Visit (HOSPITAL_COMMUNITY): Admission: RE | Admit: 2016-02-12 | Payer: PPO | Source: Ambulatory Visit

## 2016-02-23 ENCOUNTER — Ambulatory Visit (HOSPITAL_COMMUNITY)
Admission: RE | Admit: 2016-02-23 | Discharge: 2016-02-23 | Disposition: A | Discharge status: Home / Self Care | Payer: PPO | Source: Ambulatory Visit | Attending: Endocrinology | Admitting: Endocrinology

## 2016-02-23 DIAGNOSIS — E049 Nontoxic goiter, unspecified: Secondary | ICD-10-CM

## 2016-03-04 DIAGNOSIS — I10 Essential (primary) hypertension: Secondary | ICD-10-CM | POA: Diagnosis not present

## 2016-03-04 DIAGNOSIS — E119 Type 2 diabetes mellitus without complications: Secondary | ICD-10-CM | POA: Diagnosis not present

## 2016-03-04 DIAGNOSIS — E039 Hypothyroidism, unspecified: Secondary | ICD-10-CM | POA: Diagnosis not present

## 2016-03-04 DIAGNOSIS — E049 Nontoxic goiter, unspecified: Secondary | ICD-10-CM | POA: Diagnosis not present

## 2016-03-05 DIAGNOSIS — M25519 Pain in unspecified shoulder: Secondary | ICD-10-CM | POA: Diagnosis not present

## 2016-03-05 DIAGNOSIS — E119 Type 2 diabetes mellitus without complications: Secondary | ICD-10-CM | POA: Diagnosis not present

## 2016-03-05 DIAGNOSIS — E785 Hyperlipidemia, unspecified: Secondary | ICD-10-CM | POA: Diagnosis not present

## 2016-03-05 DIAGNOSIS — M519 Unspecified thoracic, thoracolumbar and lumbosacral intervertebral disc disorder: Secondary | ICD-10-CM | POA: Diagnosis not present

## 2016-03-05 DIAGNOSIS — I10 Essential (primary) hypertension: Secondary | ICD-10-CM | POA: Diagnosis not present

## 2016-03-05 DIAGNOSIS — E039 Hypothyroidism, unspecified: Secondary | ICD-10-CM | POA: Diagnosis not present

## 2016-03-05 DIAGNOSIS — F5109 Other insomnia not due to a substance or known physiological condition: Secondary | ICD-10-CM | POA: Diagnosis not present

## 2016-03-05 DIAGNOSIS — H81393 Other peripheral vertigo, bilateral: Secondary | ICD-10-CM | POA: Diagnosis not present

## 2016-03-05 DIAGNOSIS — M545 Low back pain: Secondary | ICD-10-CM | POA: Diagnosis not present

## 2016-03-05 DIAGNOSIS — M1990 Unspecified osteoarthritis, unspecified site: Secondary | ICD-10-CM | POA: Diagnosis not present

## 2016-03-05 DIAGNOSIS — G4459 Other complicated headache syndrome: Secondary | ICD-10-CM | POA: Diagnosis not present

## 2016-03-05 DIAGNOSIS — Z79891 Long term (current) use of opiate analgesic: Secondary | ICD-10-CM | POA: Diagnosis not present

## 2016-03-12 DIAGNOSIS — K5792 Diverticulitis of intestine, part unspecified, without perforation or abscess without bleeding: Secondary | ICD-10-CM | POA: Diagnosis not present

## 2016-03-12 DIAGNOSIS — Z6830 Body mass index (BMI) 30.0-30.9, adult: Secondary | ICD-10-CM | POA: Diagnosis not present

## 2016-03-12 DIAGNOSIS — G4700 Insomnia, unspecified: Secondary | ICD-10-CM | POA: Diagnosis not present

## 2016-03-12 DIAGNOSIS — E782 Mixed hyperlipidemia: Secondary | ICD-10-CM | POA: Diagnosis not present

## 2016-03-12 DIAGNOSIS — Z1389 Encounter for screening for other disorder: Secondary | ICD-10-CM | POA: Diagnosis not present

## 2016-03-16 ENCOUNTER — Other Ambulatory Visit: Payer: Self-pay | Admitting: *Deleted

## 2016-03-16 NOTE — Patient Outreach (Signed)
[facility]) Care Management  03/16/2016  [name] 1951-01-03 [id number]  EMMI-Prevent referral:  Telephone call to patient; no answer & unable to leave message:  Plan: will follow up.  Sherrin Daisy, RN BSN [place] Management Coordinator [facility] Care Management  [phone]

## 2016-03-17 ENCOUNTER — Other Ambulatory Visit: Payer: Self-pay | Admitting: *Deleted

## 2016-03-17 NOTE — Patient Outreach (Signed)
[place] [facility]) Care Management  03/17/2016  UNKOWN [name] 1951-05-06 [id number]  Telephone call to patient;  No answer, unable to leave message.   Plan: Will follow up in 3 business days.  Sherrin Daisy, RN BSN [place] Management Coordinator [facility] Care Management  [phone]

## 2016-03-20 ENCOUNTER — Other Ambulatory Visit: Payer: Self-pay | Admitting: Registered Nurse

## 2016-03-20 DIAGNOSIS — Z79899 Other long term (current) drug therapy: Secondary | ICD-10-CM

## 2016-03-22 ENCOUNTER — Other Ambulatory Visit: Payer: Self-pay | Admitting: *Deleted

## 2016-03-22 NOTE — Patient Outreach (Signed)
[place] [facility]) Care Management  03/22/2016  Kim Warren [birth year] [id number]  EMMI-Prevent Referral;   Telephone call to patient who was advised of reason for referral & of [facility] care management services. HIPPA verification from patient.  Patient voices that her current need include resources for low cost transportation to medical appointments. States she currently uses taxi cab services & friends to take her to MD appointments. Patient states she is trying to research transportation options because she will have shoulder replacement surgery in the near future. States current health conditions include osteoarthritis, degenerative disc disease, chronic back pain, HTN (controlled) and degenerative arthritis of both shoulders.   Patient voices that she is getting medications without difficulty and taking consistently as prescribed by her doctors. States attending primary care provider and neurologist as scheduled. States has seen neurosurgeon & orthopedist regarding health concerns.    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  She is requesting Education officer, museum to assist with community resource needs.   Patient has been referred to her insurance benefits customer service regarding physical & occupational therapy coverage. Patient voices understanding & plans to call to inquire about coverage for these services. States will possible have orthopedic surgery in near future.  Plan; Refer to care management assistant to assign to  Clinical Social Worker for [employer] needs.  Telephonic signing off. Patient has declined RN case/disease management services.  Sherrin Daisy, RN BSN [place] Management Coordinator [facility] Care Management  [phone]

## 2016-03-24 ENCOUNTER — Other Ambulatory Visit: Payer: Self-pay | Admitting: Licensed Clinical Social Worker

## 2016-03-24 ENCOUNTER — Encounter: Payer: Self-pay | Admitting: Licensed Clinical Social Worker

## 2016-03-24 NOTE — Patient Outreach (Signed)
Assessment:  CSW received referral on Kim Warren. CSW completed chart review on client on 03/24/16. Client sees Dr. Gerarda Warren as primary care doctor.  Client has some family support.  Client has prescribed medications and is taking medications as prescribed. Client spoke previously with RN Kim Warren regarding current client needs. Client did not agree to [place]. However, client did request [facility] CSW support related to community resources information for client.  CSW spoke via phone with client on 03/24/16. CSW verified identity of client. CSW received verbal permission from client on 03/24/16 for CSW to speak with client about current client needs.   CSW and client reviewed and updated needed [facility] assessments for client.  Client and CSW spoke of client care plan goal  CSW encouraged client to communicate with CSW in next 30 days to discuss community resources of assistance for client. CSW spoke with client about transport resources in area for client. CSW informed client about [facility] in [place], [place]. Kim Warren spoke with client about transport support through that agency. CSW spoke with client about in home care support through that agency Client's son does live with client. But client said she has to care for herself and cannot rely on her son for physical help.  Client does not have a car to use for transport. Client wrote down phone number for [facility].  Client said she has sleeping difficulty and has numbness in her fingers.  She said she has difficulty writing.  She said she has right shoulder pain and is taking pain medication as prescribed.  Client is well educated regarding her medical needs and current medical condition.  Client said she plans to call [facility] soon to add her name to transport list. She also plans to talk with representative of agency about in home care support services for client. CSW  thanked client for phone call with CSW. CSW gave client CSW phone number of [phone]. CSW encouraged client to call CSW as needed to discuss social work needs of client.   Plan:  Client to communicate with CSW in next 30 days to discuss community resources of assistance for client  CSW to call client in 3 weeks to assess client needs.  Kim Warren.Kim Warren MSW, LCSW Licensed Clinical Social Worker [facility] Care Management [phone]

## 2016-03-25 ENCOUNTER — Other Ambulatory Visit (HOSPITAL_COMMUNITY): Payer: Self-pay | Admitting: Registered Nurse

## 2016-03-25 DIAGNOSIS — Z1231 Encounter for screening mammogram for malignant neoplasm of breast: Secondary | ICD-10-CM

## 2016-03-30 ENCOUNTER — Ambulatory Visit (HOSPITAL_COMMUNITY): Payer: PPO

## 2016-03-30 ENCOUNTER — Other Ambulatory Visit (HOSPITAL_COMMUNITY): Payer: PPO

## 2016-04-09 ENCOUNTER — Ambulatory Visit (HOSPITAL_COMMUNITY)
Admission: RE | Admit: 2016-04-09 | Discharge: 2016-04-09 | Disposition: A | Discharge status: Home / Self Care | Payer: PPO | Source: Ambulatory Visit | Attending: Registered Nurse | Admitting: Registered Nurse

## 2016-04-09 DIAGNOSIS — Z79899 Other long term (current) drug therapy: Secondary | ICD-10-CM

## 2016-04-09 DIAGNOSIS — Z1231 Encounter for screening mammogram for malignant neoplasm of breast: Secondary | ICD-10-CM | POA: Diagnosis not present

## 2016-04-09 DIAGNOSIS — M85832 Other specified disorders of bone density and structure, left forearm: Secondary | ICD-10-CM | POA: Insufficient documentation

## 2016-04-13 ENCOUNTER — Other Ambulatory Visit: Payer: Self-pay | Admitting: Licensed Clinical Social Worker

## 2016-04-13 NOTE — Patient Outreach (Signed)
Assessment:  CSW spoke via phone with client on 04/13/16. CSW verified client identity. CSW received verbal permission from client on 04/13/16 for CSW to speak with client about current client needs.  Client said she had prescribed medications and was taking medications as prescribed.  Client sees. [name] as primary care doctor.  CSW and client spoke of client care plan. CSW encouraged client to communicate with CSW in next 30 days to discuss community resources of assistance for client.  CSW had talked previously with client about [facility] agency in [place]. CSW had informed client previously about transport program at agency and about in home care support services through that agency. CSW and client spoke again about [facility] agency and possible support services for client with that agency.  Client said also that she was taking pain medication as prescribed. Client has some family support and some slight support from friends.  Client does not have a car to use for transport needs of client. Client said she has friends who help transport her to and from church services she attends. Client has spoken of right shoulder pain. She has consulted with Dr. Luna Glasgow, orthopedist, regarding shoulder issue of client.. She is still considering treatment options for her shoulder at this time.  She said she is also scheduled for an appointment with an orthopedic surgeon to discuss her right should issues and treatment options.  CSW talked with client about client adding her name to [facility] [place] [facility] transportation list. CSW spoke with client also about in home care services with that agency.  CSW reminded client of [facility] program support in social work, nursing and pharmacy areas.  CSW encouraged client to call CSW at [phone] as needed to discuss social work needs of client. CSW thanked [place] for phone conversation with CSW on  04/13/16. Kaliyah was appreciative of phone call from [place] on 04/13/16.   Plan:  Client to communicate with CSW in next 30 days to discuss community resources of assistance for client.  CSW to call client in 4 weeks to assess needs of client.  [name].Sanaai Doane MSW, LCSW Licensed Clinical Social Worker [facility] Care Management [phone]

## 2016-04-28 ENCOUNTER — Other Ambulatory Visit (INDEPENDENT_AMBULATORY_CARE_PROVIDER_SITE_OTHER): Payer: Self-pay | Admitting: Otolaryngology

## 2016-04-28 DIAGNOSIS — E041 Nontoxic single thyroid nodule: Secondary | ICD-10-CM

## 2016-05-04 DIAGNOSIS — E119 Type 2 diabetes mellitus without complications: Secondary | ICD-10-CM | POA: Diagnosis not present

## 2016-05-04 DIAGNOSIS — M797 Fibromyalgia: Secondary | ICD-10-CM | POA: Diagnosis not present

## 2016-05-04 DIAGNOSIS — I10 Essential (primary) hypertension: Secondary | ICD-10-CM | POA: Diagnosis not present

## 2016-05-04 DIAGNOSIS — M1990 Unspecified osteoarthritis, unspecified site: Secondary | ICD-10-CM | POA: Diagnosis not present

## 2016-05-04 DIAGNOSIS — M545 Low back pain: Secondary | ICD-10-CM | POA: Diagnosis not present

## 2016-05-04 DIAGNOSIS — M25519 Pain in unspecified shoulder: Secondary | ICD-10-CM | POA: Diagnosis not present

## 2016-05-04 DIAGNOSIS — Z79891 Long term (current) use of opiate analgesic: Secondary | ICD-10-CM | POA: Diagnosis not present

## 2016-05-04 DIAGNOSIS — E039 Hypothyroidism, unspecified: Secondary | ICD-10-CM | POA: Diagnosis not present

## 2016-05-04 DIAGNOSIS — F5109 Other insomnia not due to a substance or known physiological condition: Secondary | ICD-10-CM | POA: Diagnosis not present

## 2016-05-04 DIAGNOSIS — G4459 Other complicated headache syndrome: Secondary | ICD-10-CM | POA: Diagnosis not present

## 2016-05-04 DIAGNOSIS — K219 Gastro-esophageal reflux disease without esophagitis: Secondary | ICD-10-CM | POA: Diagnosis not present

## 2016-05-04 DIAGNOSIS — E785 Hyperlipidemia, unspecified: Secondary | ICD-10-CM | POA: Diagnosis not present

## 2016-05-06 ENCOUNTER — Other Ambulatory Visit (INDEPENDENT_AMBULATORY_CARE_PROVIDER_SITE_OTHER): Payer: Self-pay | Admitting: Otolaryngology

## 2016-05-06 DIAGNOSIS — E041 Nontoxic single thyroid nodule: Secondary | ICD-10-CM

## 2016-05-10 ENCOUNTER — Telehealth: Payer: Self-pay | Admitting: Orthopaedic Surgery

## 2016-05-10 NOTE — Telephone Encounter (Signed)
Patient left message on voicemail saying that she saw Dr. Luna Glasgow back in July. She said that they discussed him referring her to a surgeon for Bilateral Shoulder replacement. She has decided she wants to have it and she wants to be referred to Dr. Justice Britain in [place].   Please call and advise.

## 2016-05-11 ENCOUNTER — Other Ambulatory Visit: Payer: Self-pay | Admitting: Radiology

## 2016-05-11 ENCOUNTER — Other Ambulatory Visit (HOSPITAL_COMMUNITY): Payer: Self-pay | Admitting: Registered Nurse

## 2016-05-11 DIAGNOSIS — M19011 Primary osteoarthritis, right shoulder: Secondary | ICD-10-CM

## 2016-05-11 DIAGNOSIS — K5792 Diverticulitis of intestine, part unspecified, without perforation or abscess without bleeding: Secondary | ICD-10-CM

## 2016-05-11 NOTE — Telephone Encounter (Signed)
I called the patient and told her I have faxed her information and the referral request to Dr. Onnie Graham at [facility] and to expect a call from them.

## 2016-05-13 ENCOUNTER — Other Ambulatory Visit: Payer: Self-pay | Admitting: Licensed Clinical Social Worker

## 2016-05-13 NOTE — Patient Outreach (Signed)
Assessment:  CSW spoke via phone with client. CSW verified client identity. CSW received verbal permission from client on 05/13/16 for CSW to speak with client about current client needs. Client has declined [facility] nursing support. However, she had agreed to [facility] CSW services support. CSW spoke with client about [facility] consent form completion. Freda Jackson, case management assistant, plans to mail [facility] consent form to client on 05/13/16 for client to review, complete and return to [facility] office in [place], [place]  Client said she would read and complete [facility] consent form and would mail completed [facility] consent form to [facility] office in [place], [place].  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 CSW has been communicating previously with client about community resources for transport help for client. CSW has given client the name, address and phone number for [facility] in [place], [place] has encouraged client previouslly to call [facility] to add her name to transport list for that agency. Client is hoping to have shoulder surgery in the future.  Thus, CSW had encouraged her to go ahead and place her name on [facility] transport list to begin receiving transport help from that agency. She feels that she will need further [facility] transport help following her shoulder surgery in the future.  Client said she had her prescribed medications and is taking medications as prescribed. She sees [name] as her primary care doctor. CSW and client spoke of client care plan. CSW encouraged client to communicate with CSW in next 30 days to discuss community resources of assistance for client. Client said she is trying to schedule appointment with orthopedic surgeon to discuss her shoulder issues.  She said she hopes to have surgery first on her right shoulder since she is having more pain issues with her right shoulder. She is taking pain medication as prescribed.  CSW reminded  client that [facility] in [place] also has in home aides for hire to help with in home support at an hourly rate. Client said she did not need in home aide at present but following her shoulder surgery she may need in home aide support at that time. CSW spoke with client about [facility] program support in social work, nursing and pharmacy areas. CSW encouraged client to call CSW at [phone] as needed to discuss social work needs of client.  Client was appreciative of call from [place] on 05/13/16.    Plan:  Client to communicate with CSW in next 30 days to discuss community resources of assistance for client.    CSW to call client in 4 weeks to assess client needs at that time.  [name].Anagabriela Jokerst MSW, LCSW Licensed Clinical Social Worker [facility] Care Management [phone]

## 2016-05-17 NOTE — Telephone Encounter (Signed)
Noted  

## 2016-05-25 ENCOUNTER — Ambulatory Visit (HOSPITAL_COMMUNITY): Admission: RE | Admit: 2016-05-25 | Payer: PPO | Source: Ambulatory Visit

## 2016-05-26 ENCOUNTER — Other Ambulatory Visit (INDEPENDENT_AMBULATORY_CARE_PROVIDER_SITE_OTHER): Payer: Self-pay | Admitting: Otolaryngology

## 2016-05-26 DIAGNOSIS — E041 Nontoxic single thyroid nodule: Secondary | ICD-10-CM

## 2016-05-27 ENCOUNTER — Ambulatory Visit (INDEPENDENT_AMBULATORY_CARE_PROVIDER_SITE_OTHER): Payer: PPO | Admitting: Otolaryngology

## 2016-06-02 DIAGNOSIS — M19012 Primary osteoarthritis, left shoulder: Secondary | ICD-10-CM | POA: Diagnosis not present

## 2016-06-02 DIAGNOSIS — M19011 Primary osteoarthritis, right shoulder: Secondary | ICD-10-CM | POA: Diagnosis not present

## 2016-06-08 ENCOUNTER — Ambulatory Visit (HOSPITAL_COMMUNITY): Admission: RE | Admit: 2016-06-08 | Payer: PPO | Source: Ambulatory Visit

## 2016-06-15 ENCOUNTER — Other Ambulatory Visit: Payer: Self-pay | Admitting: Licensed Clinical Social Worker

## 2016-06-15 ENCOUNTER — Other Ambulatory Visit (HOSPITAL_COMMUNITY): Payer: Self-pay | Admitting: Registered Nurse

## 2016-06-15 DIAGNOSIS — K5792 Diverticulitis of intestine, part unspecified, without perforation or abscess without bleeding: Secondary | ICD-10-CM

## 2016-06-15 NOTE — Patient Outreach (Signed)
Assessment:  CSW spoke via phone with client. CSW verified client identity. CSW received verbal permission from client on 06/15/16 for CSW to speak with client about current needs and status of client.  Client sees [name] as primary care doctor. Client said she had her prescribed medications and is taking medications as prescribed. Freda Jackson, case management assistance, mailed [facility] consent form and return envelope  to client on 11/16/.17. 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 CSW and client spoke of client care plan. CSW encouraged client to communicate with CSW in next 30 days to discuss community resources of assistance for client. CSW had communicated previously with client about [facility] agency. CSW had encouraged client  to call that agency and add her name to transport list for [facility] to begin receiving transport help from that agency.  Client said she met recently with orthopedic surgeon, Dr. Onnie Graham, to discuss her right shoulder surgery needs. She said she and Dr. Onnie Graham spoke of potential surgery for client on right shoulder of client.  She said Dr. Onnie Graham said that following such a surgery he would send client home with needed supports in place. She said she was thinking that she may have right shoulder surgery possibly in March of 2018. She said she will have to schedule appointment with a cardiologist first and complete appointment with cardiologist prior to surgery. She said she had transport support as needed at present. She said she had dental needs and was planning on seeing local dentist soon to evaluate her current dental needs.  She said she takes pain medication as prescribed. She said she may need assistance through [facility] agency when she had her surgery on right shoulder in the future.  CSW reminded client that [facility] program support was in areas of nursing,  social work and pharmacy. CSW thanked client for phone call with CSW on 06/15/16.  CSW encouraged client to call CSW at [phone] as needed to discuss social work needs of client. Client was appreciative of phone call from [place] on 06/15/16.   Plan:  Client to communicate with CSW in next 30 days to discuss community resources of assistance for client.  CSW to call client in 4 weeks to assess client needs at that time.  [name].Rilda Bulls MSW, LCSW Licensed Clinical Social Worker [facility] Care Management [phone]

## 2016-07-01 DIAGNOSIS — G4459 Other complicated headache syndrome: Secondary | ICD-10-CM | POA: Diagnosis not present

## 2016-07-01 DIAGNOSIS — K219 Gastro-esophageal reflux disease without esophagitis: Secondary | ICD-10-CM | POA: Diagnosis not present

## 2016-07-01 DIAGNOSIS — M797 Fibromyalgia: Secondary | ICD-10-CM | POA: Diagnosis not present

## 2016-07-01 DIAGNOSIS — E785 Hyperlipidemia, unspecified: Secondary | ICD-10-CM | POA: Diagnosis not present

## 2016-07-01 DIAGNOSIS — E119 Type 2 diabetes mellitus without complications: Secondary | ICD-10-CM | POA: Diagnosis not present

## 2016-07-01 DIAGNOSIS — M25519 Pain in unspecified shoulder: Secondary | ICD-10-CM | POA: Diagnosis not present

## 2016-07-01 DIAGNOSIS — I10 Essential (primary) hypertension: Secondary | ICD-10-CM | POA: Diagnosis not present

## 2016-07-01 DIAGNOSIS — F5109 Other insomnia not due to a substance or known physiological condition: Secondary | ICD-10-CM | POA: Diagnosis not present

## 2016-07-01 DIAGNOSIS — M1990 Unspecified osteoarthritis, unspecified site: Secondary | ICD-10-CM | POA: Diagnosis not present

## 2016-07-01 DIAGNOSIS — E039 Hypothyroidism, unspecified: Secondary | ICD-10-CM | POA: Diagnosis not present

## 2016-07-01 DIAGNOSIS — Z79899 Other long term (current) drug therapy: Secondary | ICD-10-CM | POA: Diagnosis not present

## 2016-07-01 DIAGNOSIS — M545 Low back pain: Secondary | ICD-10-CM | POA: Diagnosis not present

## 2016-07-01 DIAGNOSIS — Z79891 Long term (current) use of opiate analgesic: Secondary | ICD-10-CM | POA: Diagnosis not present

## 2016-07-16 ENCOUNTER — Other Ambulatory Visit: Payer: Self-pay | Admitting: Licensed Clinical Social Worker

## 2016-07-16 NOTE — Patient Outreach (Signed)
Assessment:  CSW spoke via phone with client. CSW verified client identity. CSW received verbal permission from client on 07/16/16 for CSW to speak with client about current client needs and status. Client sees [name] as primary care doctor. Client said she had her prescribed medications and is taking medications as prescribed. Client said she is trying to attend scheduled client medical appointments.  CSW and client spoke of client care plan. CSW encouraged client to communicate with  CSW in next 30 days to discuss community resources of assistance for client. CSW has talked several times with client about [place]. CSW has encouraged client several times to call [facility] to add client's name to [place] transportation assistance list. Client takes pain medication as prescribed. She has previously talked about her need possibly for shoulder surgery. She has talked with Dr. Onnie Graham, orthopedic surgeon, about her shoulder surgery needs. She said she may have to contact [facility] following such a surgery to arrange for in home assistance for her following such a surgery. Client said she is working with dentist to schedule appointment for tooth issue of client. Client said she is trying to schedule an appointment for client with a dentist for next week. She said also that she would need to have appointment with a cardiologist prior to her scheduled surgery. She said she was hoping to be able to possibly have shoulder surgery needs in March of 2018. CSW thanked client for phone call with CSW on 07/16/16. CSW encouraged client to call CSW at [phone] as needed to discuss social work needs of client.     Plan:  Client to communicate with CSW in next 30 days to discuss community resources of assistance for client.  CSW to call client in 4 weeks to assess client needs at that time.     [name].Vicy Medico MSW, LCSW Licensed Clinical Social  Worker [facility] Care Management [phone]

## 2016-08-20 ENCOUNTER — Other Ambulatory Visit: Payer: Self-pay | Admitting: Licensed Clinical Social Worker

## 2016-08-20 NOTE — Patient Outreach (Signed)
Assessment;  CSW spoke via phone with client. CSW verified client identity. CSW received verbal permission from client on 08/20/16 for CSW to speak with client about current client needs. Client said she had her prescribed medications and is taking medications as prescribed. Client sees [name] as primary care doctor.  Client is attending medical appointments for client as scheduled. CSW and client spoke of client care plan. CSW encouraged client to communicate with CSW in next 30 days to discuss community resources of assistance for client. CSW has encouraged client to call [place] to add client's name to [place] transport assistance list. Client takes pain medication as prescribed. Client has talked previously with Dr. Onnie Graham, orthopedic surgeon, regarding possible client need for shoulder surgery. Client spoke of current dental needs of client.  Client is seeking dentist to care for client's dental needs.   Client has [employer]. She said she will need to have dental needs addressed first before she could consider talking more with Dr. Onnie Graham about possible client shoulder surgery.  Client is eating well and sleeping well.  She enjoys attending church and attending Bible Study group for relaxation. CSW encouraged client to call CSW at [phone] as needed to discuss social work needs of client. Client was very appreciative of CSW call to client on 08/20/16.   Plan:  Client to communicate with CSW in next 30 days to discuss community resources of assistance for client.  CSW to call client in 4 weeks to assess client needs at that time.  [name].Jarmel Linhardt MSW, LCSW Licensed Clinical Social Worker [facility] Care Management [phone]

## 2016-08-26 DIAGNOSIS — Z79899 Other long term (current) drug therapy: Secondary | ICD-10-CM | POA: Diagnosis not present

## 2016-08-26 DIAGNOSIS — G4459 Other complicated headache syndrome: Secondary | ICD-10-CM | POA: Diagnosis not present

## 2016-08-26 DIAGNOSIS — E039 Hypothyroidism, unspecified: Secondary | ICD-10-CM | POA: Diagnosis not present

## 2016-08-26 DIAGNOSIS — F5109 Other insomnia not due to a substance or known physiological condition: Secondary | ICD-10-CM | POA: Diagnosis not present

## 2016-08-26 DIAGNOSIS — M1990 Unspecified osteoarthritis, unspecified site: Secondary | ICD-10-CM | POA: Diagnosis not present

## 2016-08-26 DIAGNOSIS — M545 Low back pain: Secondary | ICD-10-CM | POA: Diagnosis not present

## 2016-08-26 DIAGNOSIS — E119 Type 2 diabetes mellitus without complications: Secondary | ICD-10-CM | POA: Diagnosis not present

## 2016-08-26 DIAGNOSIS — I10 Essential (primary) hypertension: Secondary | ICD-10-CM | POA: Diagnosis not present

## 2016-08-26 DIAGNOSIS — M25519 Pain in unspecified shoulder: Secondary | ICD-10-CM | POA: Diagnosis not present

## 2016-08-26 DIAGNOSIS — E785 Hyperlipidemia, unspecified: Secondary | ICD-10-CM | POA: Diagnosis not present

## 2016-08-26 DIAGNOSIS — M797 Fibromyalgia: Secondary | ICD-10-CM | POA: Diagnosis not present

## 2016-09-10 DIAGNOSIS — K047 Periapical abscess without sinus: Secondary | ICD-10-CM | POA: Diagnosis not present

## 2016-09-10 DIAGNOSIS — E063 Autoimmune thyroiditis: Secondary | ICD-10-CM | POA: Diagnosis not present

## 2016-09-10 DIAGNOSIS — K219 Gastro-esophageal reflux disease without esophagitis: Secondary | ICD-10-CM | POA: Diagnosis not present

## 2016-09-10 DIAGNOSIS — E782 Mixed hyperlipidemia: Secondary | ICD-10-CM | POA: Diagnosis not present

## 2016-09-10 DIAGNOSIS — R778 Other specified abnormalities of plasma proteins: Secondary | ICD-10-CM | POA: Diagnosis not present

## 2016-09-10 DIAGNOSIS — E669 Obesity, unspecified: Secondary | ICD-10-CM | POA: Diagnosis not present

## 2016-09-10 DIAGNOSIS — Z6830 Body mass index (BMI) 30.0-30.9, adult: Secondary | ICD-10-CM | POA: Diagnosis not present

## 2016-09-10 DIAGNOSIS — E6609 Other obesity due to excess calories: Secondary | ICD-10-CM | POA: Diagnosis not present

## 2016-09-10 DIAGNOSIS — E119 Type 2 diabetes mellitus without complications: Secondary | ICD-10-CM | POA: Diagnosis not present

## 2016-09-10 DIAGNOSIS — L03039 Cellulitis of unspecified toe: Secondary | ICD-10-CM | POA: Diagnosis not present

## 2016-09-10 DIAGNOSIS — L720 Epidermal cyst: Secondary | ICD-10-CM | POA: Diagnosis not present

## 2016-09-10 DIAGNOSIS — Z1389 Encounter for screening for other disorder: Secondary | ICD-10-CM | POA: Diagnosis not present

## 2016-09-10 DIAGNOSIS — R509 Fever, unspecified: Secondary | ICD-10-CM | POA: Diagnosis not present

## 2016-09-20 ENCOUNTER — Other Ambulatory Visit: Payer: Self-pay | Admitting: Licensed Clinical Social Worker

## 2016-09-20 NOTE — Patient Outreach (Signed)
Assessment:  CSW spoke via phone with client. CSW verified client identity. CSW received verbal permission from client on 09/20/16 for CSW to speak with client about current client needs. Client sees [name] as primary care doctor. Client said she had a medical appointment with [name] last week. She said [name] had prescribed antibiotic for her and she is taking antibiotic as prescribed.  Client said she is trying to attend scheduled client medical appointments.  Client said she had her prescribed medications and is taking medications as prescribed. CSW and client spoke of client care plan. CSW encouraged client to communicate with CSW in next 30 days to discuss community resources of assistance to client. Client is taking pain medications as prescribed.Client has previously discussed dental needs of client. Client said she is scheduled to communicate with local dentist this week to discuss her dental needs. Client has also communicated with [facility] agency in [place], [place] to discuss dental needs and possible dental costs for client.  Client has [employer].  Client said she is eating well and sleeping adequately. Client said she enjoys attending church and attending Bible Study group at church for relaxation. Client said she had transport assistance as needed at present. Client has talked previously with orthopedic surgeon, Dr. Onnie Graham, about possible client shoulder surgery. Client said she has pain issues and mobility issues related to her right shoulder. She has talked with her primary doctor and with Dr. Onnie Graham about her right should pain and mobility issues.CSW thanked client for phone call with CSW on 09/20/16. CSW encouraged client to call CSW at [phone] as needed to discuss social work needs of client.   Plan:  Client to communicate with CSW in next 30 days to discuss community resources of assistance to client.     CSW to call client in 4 weeks to assess  client needs.  [name].Virgilio Broadhead MSW, LCSW Licensed Clinical Social Worker [facility] Care Management [phone]

## 2016-09-21 ENCOUNTER — Encounter (INDEPENDENT_AMBULATORY_CARE_PROVIDER_SITE_OTHER): Payer: Self-pay | Admitting: *Deleted

## 2016-09-28 NOTE — Progress Notes (Signed)
Cardiology Office Note   Date:  09/30/2016   ID:  Kim Warren, DOB 09-Apr-1951, MRN [id number]  PCP:  Glo Herring, MD  Cardiologist:   Jenkins Rouge, MD   No chief complaint on file.     History of Present Illness: Kim Warren is a 66 y.o. female who presents for evaluation/consultation of heart murmur and fever. Referred by Dr [facility]. PMH Includes HTN, DM and obesity Also has hypothyroidism on replacement Rx Vague mention of antibiotic Rx for tooth and toe infections with clindamycin In office note from 09/10/16 She apparently has been trying to get f/u with a dentist  Also some chronic right shoulder pain she has seen Dr Onnie Graham for   Labs reviewed A1c 6.5 TsH 2  She indicates murmur first heard a year ago. Dr Onnie Graham will eventually need to replace both shoulders and he was concerned about Risk of infection and murmur   Past Medical History:  Diagnosis Date  . Arthritis   . Degenerative joint disease of spine    SEVERE LOW BACK PAIN-DDD, SOMETIMES PAIN IN LOWER LEGS;  PT ALSO HAS DDD CERVICAL - WITH NECK PAIN THAT RADIATES TO BOTH SHOULDERS AND ARMS AND NUMBNESS FINGERS BOTH HANDS.  AS OF 09/18/13 - PT HAS NOTICED WORSENING OF NECK, SHOULDER PAIN - "SEARING PAIN" & FEELING OF ELECTRICAL SENSATIONS - SHE PLANS TO GET IN TOUCH WITH HER NEUROLOGIST THIS WEEK BEFORE PLANNED NISSEN SURG ON 3/30  . Dysphagia   . Dysrhythmia    BENIGN PVCS  . Fibromyalgia   . GERD (gastroesophageal reflux disease)   . H/O hiatal hernia   . Headache(784.0)    sinus headaches-seasonal  . History of kidney stones   . Hoarseness of voice    PT RELATES TO GERD PROBLEM  . Hypertension   . Hypothyroidism   . Neuromuscular disorder (HCC)    fibromyalgia  . Onychomycosis    LEFT BIG TOE  . Palpitations     Past Surgical History:  Procedure Laterality Date  . ABDOMINAL HYSTERECTOMY  1990   partial  . BRAIN SURGERY  1995   meningioma-benign  . BRAVO [place] STUDY N/A 05/09/2013     Procedure: BRAVO [place];  Surgeon: Arta Silence, MD;  Location: [facility] ENDOSCOPY;  Service: Endoscopy;  Laterality: N/A;  . CHOLECYSTECTOMY    . COLONOSCOPY  06/08/06  . DILATION AND CURETTAGE OF UTERUS    . ESOPHAGOGASTRODUODENOSCOPY (EGD) WITH PROPOFOL N/A 05/09/2013   Procedure: ESOPHAGOGASTRODUODENOSCOPY (EGD) WITH PROPOFOL;  Surgeon: Arta Silence, MD;  Location: [facility] ENDOSCOPY;  Service: Endoscopy;  Laterality: N/A;  . LAPAROSCOPIC NISSEN FUNDOPLICATION N/A 5/46/5681   Procedure: LAPAROSCOPIC NISSEN AND HIATAL HERNIA REPAIR;  Surgeon: Pedro Earls, MD;  Location: [facility] ORS;  Service: General;  Laterality: N/A;  . PARATHYROID EXPLORATION  2011   tumor off-benign  . periosteal chondroma  1979   left middle finger-benign  . WISDOM TOOTH EXTRACTION       Current Outpatient Prescriptions  Medication Sig Dispense Refill  . butalbital-acetaminophen-caffeine (FIORICET, ESGIC) 50-325-40 MG tablet Take 1 tablet by mouth 2 (two) times daily as needed for headache.    . clindamycin (CLEOCIN) 300 MG capsule     . diclofenac sodium (VOLTAREN) 1 % GEL Apply 2 g topically 4 (four) times daily as needed (Pain). Reported on 10/02/2015    . diphenhydrAMINE (BENADRYL) 25 MG tablet Take 25 mg by mouth every 6 (six) hours as needed.    . diphenoxylate-atropine (  LOMOTIL) 2.5-0.025 MG tablet Take by mouth 4 (four) times daily as needed for diarrhea or loose stools.    . fenofibrate 160 MG tablet Take 160 mg by mouth at bedtime.    . hydrochlorothiazide (HYDRODIURIL) 25 MG tablet Take 25 mg by mouth daily as needed (Blood Pressure).     Marland Kitchen levothyroxine (SYNTHROID, LEVOTHROID) 112 MCG tablet     . lisinopril (PRINIVIL,ZESTRIL) 10 MG tablet Take 10 mg by mouth every morning.    . loratadine (CLARITIN) 10 MG tablet Take 10 mg by mouth every morning.    . meclizine (ANTIVERT) 25 MG tablet Take 25 mg by mouth 3 (three) times daily as needed for dizziness.    . methocarbamol (ROBAXIN) 500 MG tablet Take 500 mg  by mouth 3 (three) times daily.     . metoprolol succinate (TOPROL-XL) 50 MG 24 hr tablet Take 50 mg by mouth every morning. Take with or immediately following a meal.    . ondansetron (ZOFRAN) 4 MG tablet Take 4 mg by mouth every 8 (eight) hours as needed for nausea or vomiting.    Marland Kitchen oxyCODONE-acetaminophen (PERCOCET) 7.5-325 MG tablet Take 1 tablet by mouth every 6 (six) hours as needed for moderate pain.    . pseudoephedrine (SUDAFED) 30 MG tablet Take 60 mg by mouth daily as needed (allergies).    . simethicone (MYLICON) 80 MG chewable tablet Chew 180 mg by mouth every 6 (six) hours as needed for flatulence.    . sodium phosphates (OSMOPREP) 1.102-0.398 g TABS tablet Take 1 tablet by mouth once. 32 tablet 0  . zolpidem (AMBIEN) 10 MG tablet Take 3.33 mg by mouth at bedtime as needed for sleep (Patient usually cuts it into a third).     No current facility-administered medications for this visit.     Allergies:   Erythromycin; Morphine and related; Nsaids; Ciprofloxacin; and Flagyl [metronidazole]    Social History:  The patient  reports that she has never smoked. She has never used smokeless tobacco. She reports that she does not drink alcohol or use drugs.   Family History:  The patient's family history includes Cancer in her mother; Heart disease in her father.    ROS:  Please see the history of present illness.   Otherwise, review of systems are positive for none.   All other systems are reviewed and negative.    PHYSICAL EXAM: VS:  BP (!) 144/79 (BP Location: Right Arm)   Pulse 79   Ht 5' (1.524 m)   Wt 168 lb (76.2 kg)   SpO2 97%   BMI 32.81 kg/m  , BMI Body mass index is 32.81 kg/m. Affect appropriate Healthy:  appears stated age 43: normal Neck supple with no adenopathy JVP normal no bruits no thyromegaly Lungs clear with no wheezing and good diaphragmatic motion Heart:  S1/S2 1/6 SEM  murmur, no rub, gallop or click PMI normal Abdomen: benighn, BS positve, no  tenderness, no AAA no bruit.  No HSM or HJR Distal pulses intact with no bruits No edema Neuro non-focal Skin warm and dry No muscular weakness    EKG:  11/18/15 SR rate 64 normal ECG    Recent Labs: 11/18/2015: BUN 20; Creatinine, Ser 0.93; Hemoglobin 13.8; Platelets 287; Potassium 4.0; Sodium 136    Lipid Panel No results found for: CHOL, TRIG, HDL, CHOLHDL, VLDL, LDLCALC, LDLDIRECT    Wt Readings from Last 3 Encounters:  09/30/16 168 lb (76.2 kg)  01/21/16 164 lb 6.4 oz (74.6 kg)  12/11/15 166 lb (75.3 kg)      Other studies Reviewed: Additional studies/ records that were reviewed today include: Notes from Dr Gerarda Fraction 09/12/16 Notes in [place] from Patient Outreach and labs/ECG.    ASSESSMENT AND PLAN:  1. Murmur benign sounding aortic outflow murmur given chronic dental infection f/u echo doubt SBE/Vegetations  2. Fever/ID finishing 7 day course of clindamycin has dental appt Tuesday no need for SBE 3. Thyroid on replacement labs with Dr Gerarda Fraction 4. GERD Discussed weight loss and low carb diet continue PPI 5. HTN Well controlled.  Continue current medications and low sodium Dash type diet.   6. PVC benign asymptomatic f/u echo for EG    Current medicines are reviewed at length with the patient today.  The patient does not have concerns regarding medicines.  The following changes have been made:  no change  Labs/ tests ordered today include: Echo   Orders Placed This Encounter  Procedures  . ECHOCARDIOGRAM COMPLETE     Disposition:   FU with [place] PRN      Signed, Jenkins Rouge, MD  09/30/2016 1:54 PM    [facility] [address], [place], [place]  [address] Phone: [phone]; Fax: [phone]

## 2016-09-30 ENCOUNTER — Encounter: Payer: Self-pay | Admitting: Cardiovascular Disease

## 2016-09-30 ENCOUNTER — Ambulatory Visit (INDEPENDENT_AMBULATORY_CARE_PROVIDER_SITE_OTHER): Payer: PPO | Admitting: Cardiovascular Disease

## 2016-09-30 VITALS — BP 144/79 | HR 79 | Ht 60.0 in | Wt 168.0 lb

## 2016-09-30 DIAGNOSIS — R011 Cardiac murmur, unspecified: Secondary | ICD-10-CM

## 2016-09-30 NOTE — Patient Instructions (Signed)
Your physician recommends that you schedule a follow-up appointment in: as needed     Your physician has requested that you have an echocardiogram. Echocardiography is a painless test that uses sound waves to create images of your heart. It provides your doctor with information about the size and shape of your heart and how well your heart's chambers and valves are working. This procedure takes approximately one hour. There are no restrictions for this procedure.       Thank you for choosing [facility] !

## 2016-10-14 ENCOUNTER — Ambulatory Visit (HOSPITAL_COMMUNITY)
Admission: RE | Admit: 2016-10-14 | Discharge: 2016-10-14 | Disposition: A | Discharge status: Home / Self Care | Payer: PPO | Source: Ambulatory Visit | Attending: Cardiovascular Disease | Admitting: Cardiovascular Disease

## 2016-10-14 DIAGNOSIS — I5189 Other ill-defined heart diseases: Secondary | ICD-10-CM | POA: Insufficient documentation

## 2016-10-14 DIAGNOSIS — R011 Cardiac murmur, unspecified: Secondary | ICD-10-CM

## 2016-10-14 DIAGNOSIS — I517 Cardiomegaly: Secondary | ICD-10-CM | POA: Diagnosis not present

## 2016-10-14 NOTE — Progress Notes (Signed)
*  PRELIMINARY RESULTS* Echocardiogram 2D Echocardiogram has been performed.  Leavy Cella 10/14/2016, 1:59 PM

## 2016-10-21 ENCOUNTER — Other Ambulatory Visit: Payer: Self-pay | Admitting: Licensed Clinical Social Worker

## 2016-10-21 NOTE — Patient Outreach (Signed)
Assessment:  CSW spoke via phone with client. CSW verified client identity. CSW received verbal permission from client on 10/21/16 for CSW to speak with client about current client needs. Client sees [name] as primary care doctor.   Client said she is trying to attend scheduled client medical appointments. She said she had prescribed medications and was taking medications as prescribed. CSW and client spoke of client care plan.  CSW encouraged client to communicate with CSW in next 30 days to discuss community resources of assistance for client.  Client is taking pain medication as prescribed. Client has previously discussed dental needs of client and has researched several local providers to assist possibly with dental care needs of client. Client has [employer]. Client said she had transport assistance as needed at present to take her to and from her scheduled medical appointments.Client had recent appointment with cardiologist.  Client also had an echocardiogram.   Client has dental appointment today to pull a tooth.  Client has no fever.  Client takes a prescribed pain medication. Client said she is glad she had dental appointment today. She still hopes to be able to eventually be able to have surgery on her right shoulder as needed. She has previously talked with Dr. Onnie Graham about right shoulder issues of client and possible right shoulder surgery for client. Client was appreciative of call from [place] on 10/21/16. CSW encouraged client to call CSW at [phone] as needed to discuss social work needs of client.    Plan:  Client to communicate with CSW in next 30 days to discuss community resources of assistance to client.  CSW to call client in 4 weeks to assess client needs.   [name].Katherene Dinino MSW, LCSW Licensed Clinical Social Worker [facility] Care Management [phone]

## 2016-10-26 DIAGNOSIS — I10 Essential (primary) hypertension: Secondary | ICD-10-CM | POA: Diagnosis not present

## 2016-10-26 DIAGNOSIS — M545 Low back pain: Secondary | ICD-10-CM | POA: Diagnosis not present

## 2016-10-26 DIAGNOSIS — G4459 Other complicated headache syndrome: Secondary | ICD-10-CM | POA: Diagnosis not present

## 2016-10-26 DIAGNOSIS — M797 Fibromyalgia: Secondary | ICD-10-CM | POA: Diagnosis not present

## 2016-10-26 DIAGNOSIS — E039 Hypothyroidism, unspecified: Secondary | ICD-10-CM | POA: Diagnosis not present

## 2016-10-26 DIAGNOSIS — M25519 Pain in unspecified shoulder: Secondary | ICD-10-CM | POA: Diagnosis not present

## 2016-10-26 DIAGNOSIS — E119 Type 2 diabetes mellitus without complications: Secondary | ICD-10-CM | POA: Diagnosis not present

## 2016-10-26 DIAGNOSIS — Z79899 Other long term (current) drug therapy: Secondary | ICD-10-CM | POA: Diagnosis not present

## 2016-10-26 DIAGNOSIS — E785 Hyperlipidemia, unspecified: Secondary | ICD-10-CM | POA: Diagnosis not present

## 2016-10-26 DIAGNOSIS — M1990 Unspecified osteoarthritis, unspecified site: Secondary | ICD-10-CM | POA: Diagnosis not present

## 2016-10-26 DIAGNOSIS — H81393 Other peripheral vertigo, bilateral: Secondary | ICD-10-CM | POA: Diagnosis not present

## 2016-10-26 DIAGNOSIS — F5109 Other insomnia not due to a substance or known physiological condition: Secondary | ICD-10-CM | POA: Diagnosis not present

## 2016-11-19 ENCOUNTER — Other Ambulatory Visit: Payer: Self-pay | Admitting: Licensed Clinical Social Worker

## 2016-11-19 NOTE — Patient Outreach (Signed)
Assessment:  CSW spoke via phone with client. CSW verified client identity. CSW  received verbal permission from Kim Warren on 11/19/16 for CSW to communicate with Kim Warren regarding client status and needs. Client sees [name] as primary care doctor. Client said she had her prescribed medications and is taking medications as prescribed. CSW and client spoke of client care plan. CSW encouraged client to communicate with CSW innext 30 days to discuss community resources of assistance for client. Client said she is attending scheduled medical appointments for client as scheduled. She is taking a pain medication as prescribed. Client has [employer]. Client attended dental appointment for client  in April of 2018.  She said she went to dentist in April of 2018. She said dentist referred her to an oral surgeon.  She said she attended appointment with oral surgeon. She said she also was communicating regularly with [employer] insurance regarding coverage for needed dental care for client. She said she was taking a prescribed pain medication. She said she has been taking prescribed antibiotics for infection in her tooth. She said she plans to communicate with office of [name] next week to discuss antibiotic use of client and discuss refill of antibiotic for client. 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 She said she is having reduced energy and is more fatigued since tooth infection began.  She is communicating with [employer] representative regarding her dental needs. She said representative of [employer] insurance is scheduled to call her again next Wednesday to discuss her dental needs and [employer] dental coverage for client. CSW thanked client for phone call with CSW on 11/19/16.  CSW encouraged Kim Warren to call CSW at [phone] as needed to discuss social work needs of client..     Plan:  Client to  communicate with CSW in next 30 days to discuss community resources of assistance for client.   CSW to call client in 1  week to assess client needs at that time.  Kim Warren.Kim Warren MSW, LCSW Licensed Clinical Social Worker [facility] Care Management [phone].

## 2016-11-23 ENCOUNTER — Other Ambulatory Visit: Payer: Self-pay | Admitting: Licensed Clinical Social Worker

## 2016-11-23 NOTE — Patient Outreach (Signed)
Assessment:  CSW spoke with Deanne Coffer ,[place] Director, on 11/23/16 about client needs and status regarding dental needs of client. CSW informed Dawn that client had been receiving [facility] CSW support for several months but client was having more involved medical/dental issues at present. Client had gone to dentist who referred client to orthodontic surgeon for evaluation and treatment. Client has [employer] and is discussing with insurance provider her coverage and treatment options for her dental needs. Dawn suggested that [place] send an order for [facility] RNCM to provide nursing support for client. Client has been discussing pain issues experienced and discussing possible need for root canal for client. Client has had several rounds of antibiotics for her dental needs and said her infection is not improving. 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 CSW thanked [employer] for discussion regarding client needs.   Plan:  CSW to send order to [facility] Case Management Assistant for client referral to [place] to provide [facility] nursing support for client.  [name].Halley Shepheard MSW, LCSW Licensed Clinical Social Worker [facility] Care Management [phone]

## 2016-11-24 ENCOUNTER — Other Ambulatory Visit: Payer: Self-pay | Admitting: Licensed Clinical Social Worker

## 2016-11-24 NOTE — Patient Outreach (Signed)
Assessment:  CSW spoke via phone with client. CSW verified client identity. CSW received verbal permission from client on 11/24/16 for CSW to speak with client about current client status and needs. CSW recently made a referral for [facility] RN CM to contact client to address nursing needs of client. Jackelyn Poling RN received this referral from [place] for client. Jackelyn Poling RN and CSW have consulted regarding  client needs and status.  Client currently has dental issues of concern. She has seen dentist and orthodontic specialist. She is talking with [employer] representative regarding dental coverage for client for current dental issue of client. Client is taking medications as prescribed. She takes a prescribed pain medication.  She drives herself to scheduled medical appointments. 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 She has said she is concerned she might develop sepsis if dental infection is not addressed and resolved soon. She has talked with [name], her primary doctor, about infection issue of client. [name] has prescribed antibiotics for client on several occasions to treat infection. Client said she has taken at least 3 rounds of antibiotics for dental infection of client. CSW also discussed with Joellyn Quails, RN, on 11/24/16 that client was concerned about possible sepsis if infection of client was not addressed. CSW informed client on 11/24/16 that CSW had made referral for client to receive nursing support with [facility] RN Joellyn Quails. Client was in agreement with referral and said she looked forward to speaking  with Joellyn Quails RN about client nursing needs. Client said she had been in communication with representative of [employer] insurance on 11/24/16 regarding insurance coverage for client's dental needs.  Client said she also is waiting on a reimbursement check from her pharmacy regarding client medication charges reimbursement.  CSW encouraged client to  call CSW at [phone] to discuss social work needs of client. Client was very appreciative of call from [place] on 11/24/16.  Plan:  Client to communicate with CSW in next 30 days to discuss community resources of assistance for client.  CSW to collaborate with RN Joellyn Quails in monitoring needs of client.  CSW to call client in 3 weeks to assess client needs at that time.   [name].Jimmy Stipes MSW, LCSW Licensed Clinical Social Worker [facility] Care Management [phone]

## 2016-11-25 ENCOUNTER — Telehealth: Payer: Self-pay | Admitting: *Deleted

## 2016-11-25 NOTE — Patient Outreach (Signed)
[facility]) Care Management  11/25/2016  Kim Warren 05-15-1951 [id number]   Care Coordination/Collaboration with [facility] SW  [facility] CM referred to [name] by [facility] [place] who report patient with medical issues related to not being able to get anesthesia needed for a tooth removal to prevent possible osteomyelitis of tooth area  PMH degenerative disc disease, tooth abscesses,  Her pcp is Dr Gerarda Fraction and she has been to him for medical issues in April 2018 and treatment provided [facility] confirms she has "no abscess now" of her teeth but reports "cry now". "shoulder pain restricts me from reaching, holding a cup, doing house chores" Her Oral surgeon is Costella Hatcher with and office manager Nevin Bloodgood who have been trying to work with her insurance company for over a year. THN CM contacted [name] who states "I have been fighting my insurance company for about a year and a half so I can have my tooth removed then so I can have my shoulder surgeries." Reports not being able to have replacement surgery on both shoulders until the source of her infection is removed which is related to her teeth (PMH of numerous infections with various, multiple doses of antibiotics)   Plans:   THN CM will attempt to collaborate with [facility] administration and HTA representative and follow up with SW and pt   Lionel [place] L. Lavina Hamman, RN, BSN, [place] Care Management [phone]

## 2016-11-26 ENCOUNTER — Other Ambulatory Visit: Payer: Self-pay | Admitting: *Deleted

## 2016-11-26 NOTE — Patient Outreach (Addendum)
[facility]) Care Management  11/26/2016  KRYSLYN HELBIG 01-09-1951 [id number]    Care coordination  [facility] CM referred to Mrs Topham by [facility] [place] who report patient with medical issues related to not being able to get anesthesia needed for a tooth removal to prevent possible osteomyelitis of tooth area  PMH degenerative disc disease, tooth abscesses,   THN CM spoke with [facility] leadership, Dawn P about concerns with this patient at 1220 today Referred CM to Oswaldo Milian and Garth Schlatter HTA staff Discussed Dr Risa Grill assistance [facility] CM spoke with Oswaldo Milian HTA representative at 1340 about concerns with this patient who inquired if pt has a dental rider (if not she would have an out of pocket expense) THN CM was referred to Garth Schlatter and [place] services at 36 Eva spoke with Garth Schlatter who confirms works with primary the medical services for HTA clients Referred THN CM to HTA Concierge THN Cm spoke with [place] to review concern of pt needing to have oral surgery with anesthesia vs novocaine to remove source of infection of teeth prior to bilateral shoulder surgery She noted Pt grievance filed and will consult the dental carrier for this HTA client's plan, and the patient to attempt to resolve issue  THN Cm offered contact information for [employer] office, Nevin Bloodgood contact information but Nadeen Landau prefers to contact client Call attempt to Mrs Asbill without success Phone rings but no answer  Email to Dr Risa Grill for updated sent   Plans  [facility] CM will follow up if possible interventions available on next week Routed noted to [place], oral surgeon  Joelene Millin L. Lavina Hamman, RN, BSN, [place] Care Management [phone]

## 2016-11-30 ENCOUNTER — Telehealth: Payer: Self-pay | Admitting: Orthopaedic Surgery

## 2016-11-30 NOTE — Telephone Encounter (Signed)
Patient called to relay that she has a problem of (?) trigger finger which she said is locked, and has been locked for almost 15 hours.  States thought about emergency room, but wasn't sure anything could be done for her there. Relayed best to contact one of the orthopaedic hand specialists. States she already has seen one of the providers at [facility], and will call there to request to be seen by hand specialist. Relayed otherwise, to go to Emergency room.

## 2016-12-10 ENCOUNTER — Telehealth: Payer: Self-pay | Admitting: *Deleted

## 2016-12-10 NOTE — Patient Outreach (Signed)
[facility]) Care Management  12/03/2016  Kim Warren 03-30-1951 [id number]   Care Coordination  THN CM called and spoke with [name] to get an update on her progress of medical services after [facility] CM previous contact to HTA plan staff [name] confirms she has had her tooth removed after being contacted by HTA members, agreeing to pay anesthesia services and being reimbursed in future by plan per pt. She confirms she was given an antibiotic after the procedure was completed today.  PMH HTN, DM, obesity, hypothyroidism,  chronic right shoulder pain Degenerative joint disease of the spine (she has seen Dr Onnie Graham for) A1c 6.5 She is very appreciative of services rendered by [facility] CM and THN SW.   Plans:  THN CM updated THN SW Note routed to pcp and care team members Immediate need met  [facility] CM Care Plan Problem One     Most Recent Value  Care Plan Problem One  Insurance coverage of medical/dental services (anesthesia) for tooth removal  Role Documenting the Problem One  Care Management [place] for Problem One  Active  THN Long Term Goal   over the next 31 days patient will be able to have tooth (source of infection) removed using insurance coverage benefits  THN Long Term Goal Start Date  11/25/16  [facility] Long Term Goal Met Date  12/03/16  Interventions for Problem One Long Term Goal  Assess/Identify patient benefit and medical needs, discussed possible solution/general insurance processes/available coverage. Contact staff for insurance plan to review medial concerns and coverage needs to check for available benefits, follow up with patient/progress of medical and  benefit services  THN CM Short Term Goal #1   over the next 14 days patient will understand available plan benefit options to meet medical needs (resolve infection source)  [facility] CM Short Term Goal #1 Start Date  11/25/16  [facility] CM Short Term Goal #1 Met Date  12/03/16  Interventions for Short Term Goal  #1  Assess/Identify patient benefit and medical needs, discussed possible solution/general insurance processes/available coverage. Contact staff for insurance plan to review medial concerns and coverage needs to check for available benefits, follow up with patient/progress of medical and  benefit services       THN CM Kimberly L. Lavina Hamman, RN, BSN, [place] Care Management [phone]

## 2016-12-13 DIAGNOSIS — M1811 Unilateral primary osteoarthritis of first carpometacarpal joint, right hand: Secondary | ICD-10-CM | POA: Diagnosis not present

## 2016-12-13 DIAGNOSIS — M65341 Trigger finger, right ring finger: Secondary | ICD-10-CM | POA: Diagnosis not present

## 2016-12-14 DIAGNOSIS — M1812 Unilateral primary osteoarthritis of first carpometacarpal joint, left hand: Secondary | ICD-10-CM | POA: Diagnosis not present

## 2016-12-14 DIAGNOSIS — M65341 Trigger finger, right ring finger: Secondary | ICD-10-CM | POA: Diagnosis not present

## 2016-12-15 ENCOUNTER — Other Ambulatory Visit: Payer: Self-pay | Admitting: Licensed Clinical Social Worker

## 2016-12-15 NOTE — Patient Outreach (Signed)
Assessment:  CSW spoke via phone with client. CSW verified client identity. CSW received verbal permission from client on 12/15/16 for CSW to communicate with client about client needs and status. Client said she is trying to attend scheduled client medical appointments. Client has prescribed medications and is taking medications as prescribed.   Kim Poling RN is providing [place] support for client. Client sees Kim Warren as primary care doctor. 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  Client and CSW spoke of client care plan. CSW encouraged client to communicate with CSW in next 30 days to discuss community resources of assistance for client.  Client has [employer]. She had seen dentist and oral surgeon regarding dental needs of client. Client also communicated with [employer] concierge to discuss dental coverage support for client through [facility] insurance.  Client reported that she did have recent dental procedure for extraction of tooth of client. Client reported to CSW that she had infected tooth extracted and also had a part of the bone in her jaw removed. She said she had received prescription for antibiotic after dental surgery. She said she now had taken all prescribed antibiotics following her dental surgery. She said she is still communicating with representative of [employer] company. Client is very appreciative of support she has received from [place] and from [place]. She said she can communicate with office of Kim Warren as needed related to medical needs of client. CSW thanked client for phone call with CSW on 12/15/16. CSW encouraged Kim Warren to call RN Kim Warren or CSW Kim Warren to discuss current needs of client.   Plan:  CSW to collaborate with RN Kim Warren in monitoring needs of client.  CSW to call client in 4 weeks to assess  client needs at that time.  [facility] CM Care Plan Problem One     Most Recent Value  Care Plan Problem One  Client needs information about community resources of assistance for client  Role Documenting the Problem One  Clinical Social Worker  Care Plan for Problem One  Active  THN CM Short Term Goal #1   Client will communicate with CSW in next 30 days to discuss community resources of assistance for client   [facility] CM Short Term Goal #1 Start Date  09/20/16  [facility] CM Short Term Goal #1 Met Date  10/21/16  Interventions for Short Term Goal #1  goal met. CSW communicated with client in above time period to discuss community resources of assistance for client.   THN CM Short Term Goal #2   Client will communicate with CSW in next 30 days to discuss community resources of assistance for client.   THN CM Short Term Goal #2 Start Date  10/21/16  [facility] CM Short Term Goal #2 Met Date  11/19/16  Interventions for Short Term Goal #2  goal met. CSW communicated with client in above time period to discuss community resources of assistance for client.   THN CM Short Term Goal #3  Client will communicate with CSW in next 30 days to discuss community resources of assistance for client.   THN CM Short Term Goal #3 Start Date  11/19/16  Interventions for Short [place]ern Goal #3  On 12/15/16, CSW communicated with client about community resources of assistance for client.       Kim Warren.Kim Warren  MSW, [place] Clinical Social Worker [facility] Care Management [phone]

## 2016-12-16 ENCOUNTER — Other Ambulatory Visit: Payer: Self-pay | Admitting: *Deleted

## 2016-12-16 NOTE — Patient Outreach (Signed)
[facility]) Care Management  12/16/2016  TAMMERA ENGERT 11-11-1950 [id number]   Care Coordination THN CM received a call from Mrs Carrell to inquire how to get in touch with Katrina and another [employer] staff to get response from reimbursement for the anesthesia procedure she had for her recent dental procedure.  THN CM discussed that she was not familiar with the staff members she was referring to.  THN CM discussed that [facility] CM believe the staff members may be related to her conceirge services She was able to find the number on the back of her insurance card.  THN CM encouraged her to return a call to [facility] CM if she was still having difficulty finding the Health team staff members she was referring to.  Mrs Monica provided an updated that she is now doing well without difficulty with signs of infection.  She voiced appreciation again for services rendered from [facility] SW and CM.   Plans  THN CM will monitor 1-2 more weeks for further needs and questions from Mrs Rennie, collaborate with [facility] SW prior to [facility] CM/nursing case closure  [place] L. Lavina Hamman, RN, BSN, [place] Care Management [phone]

## 2016-12-24 DIAGNOSIS — Z79891 Long term (current) use of opiate analgesic: Secondary | ICD-10-CM | POA: Diagnosis not present

## 2016-12-24 DIAGNOSIS — E785 Hyperlipidemia, unspecified: Secondary | ICD-10-CM | POA: Diagnosis not present

## 2016-12-24 DIAGNOSIS — M1990 Unspecified osteoarthritis, unspecified site: Secondary | ICD-10-CM | POA: Diagnosis not present

## 2016-12-24 DIAGNOSIS — Z79899 Other long term (current) drug therapy: Secondary | ICD-10-CM | POA: Diagnosis not present

## 2016-12-24 DIAGNOSIS — I10 Essential (primary) hypertension: Secondary | ICD-10-CM | POA: Diagnosis not present

## 2016-12-24 DIAGNOSIS — K219 Gastro-esophageal reflux disease without esophagitis: Secondary | ICD-10-CM | POA: Diagnosis not present

## 2016-12-24 DIAGNOSIS — M797 Fibromyalgia: Secondary | ICD-10-CM | POA: Diagnosis not present

## 2016-12-24 DIAGNOSIS — G4459 Other complicated headache syndrome: Secondary | ICD-10-CM | POA: Diagnosis not present

## 2016-12-24 DIAGNOSIS — E039 Hypothyroidism, unspecified: Secondary | ICD-10-CM | POA: Diagnosis not present

## 2016-12-24 DIAGNOSIS — G8929 Other chronic pain: Secondary | ICD-10-CM | POA: Diagnosis not present

## 2016-12-24 DIAGNOSIS — M545 Low back pain: Secondary | ICD-10-CM | POA: Diagnosis not present

## 2016-12-24 DIAGNOSIS — F5109 Other insomnia not due to a substance or known physiological condition: Secondary | ICD-10-CM | POA: Diagnosis not present

## 2016-12-24 DIAGNOSIS — E119 Type 2 diabetes mellitus without complications: Secondary | ICD-10-CM | POA: Diagnosis not present

## 2016-12-24 DIAGNOSIS — M25519 Pain in unspecified shoulder: Secondary | ICD-10-CM | POA: Diagnosis not present

## 2016-12-26 IMAGING — MR MR SHOULDER*R* W/O CM
4 of 5 series · 19 of 40 positions shown · non-contrast
Comparison: Radiographs dated 12/11/2015

CLINICAL DATA: Severe right shoulder pain and decreased range of
motion.

EXAM:
MRI OF THE RIGHT SHOULDER WITHOUT CONTRAST
TECHNIQUE: Multiplanar, multisequence MR imaging of the shoulder was performed.
No intravenous contrast was administered.

[Series 3: t2fs axial · axial · 3.0mm · 0.23mm/px · z∈[+9,+63]mm · 3 of 22 slices shown]
[im 4/22]
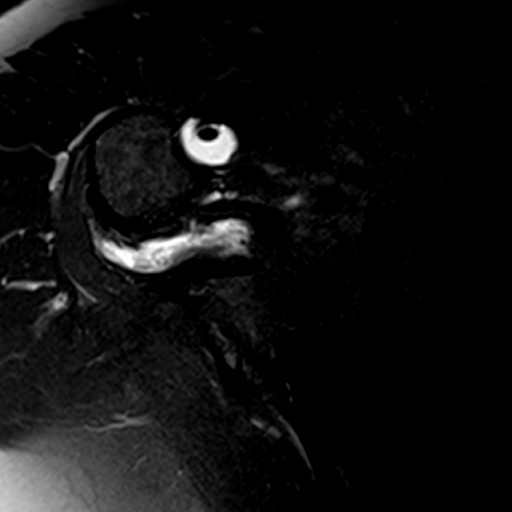
[im 13/22]
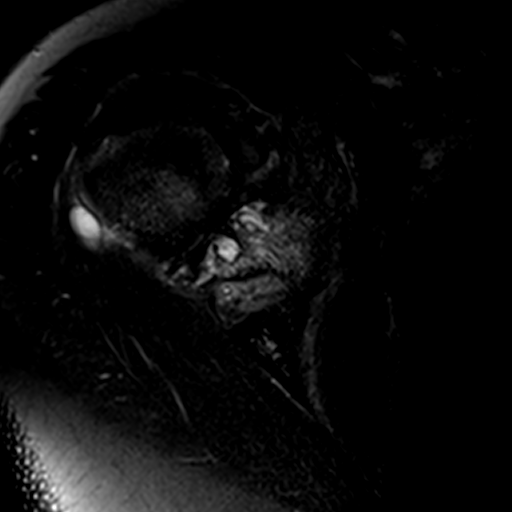
[im 19/22]
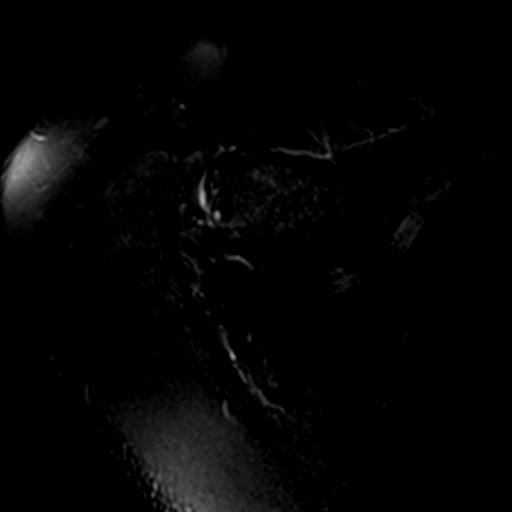

[Series 4: t2fs coronal · oblique · 3.0mm · 0.25mm/px · 3 of 22 slices shown]
[im 4/22]
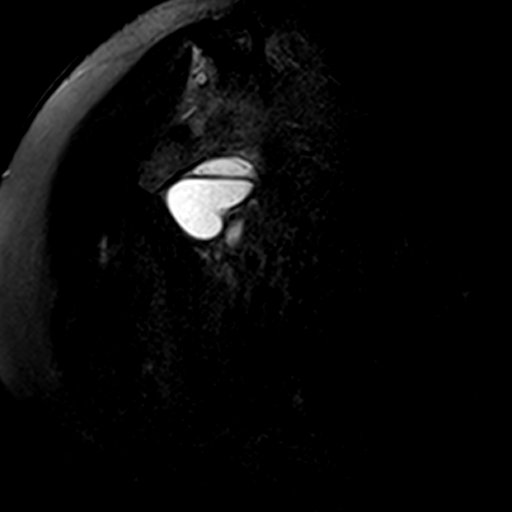
[im 11/22]
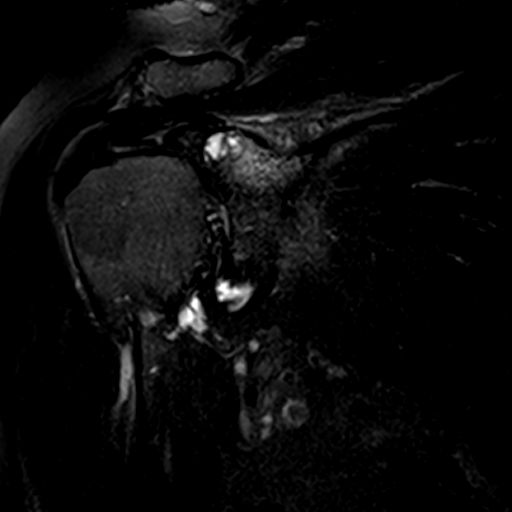
[im 18/22]
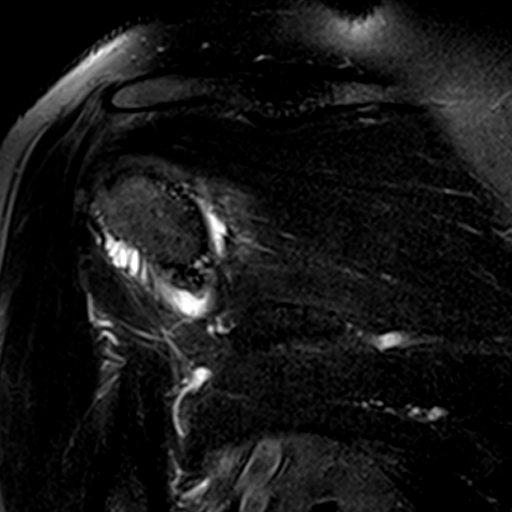

[Series 5: PD · oblique · 3.0mm · 0.26mm/px · 7 of 22 slices shown]
[im 1/22]
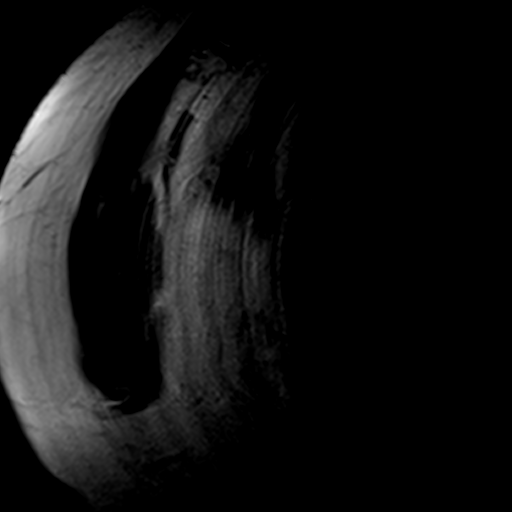
[im 4/22]
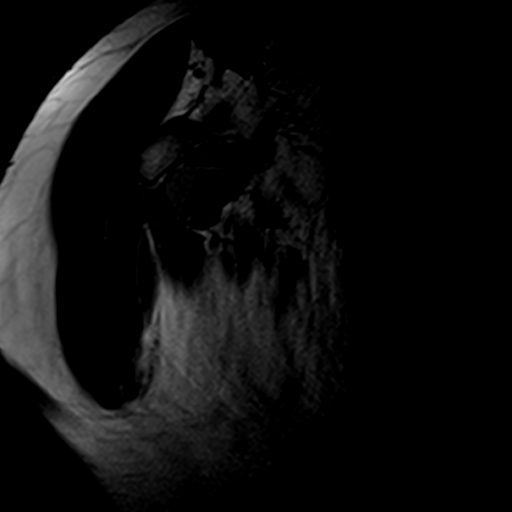
[im 8/22]
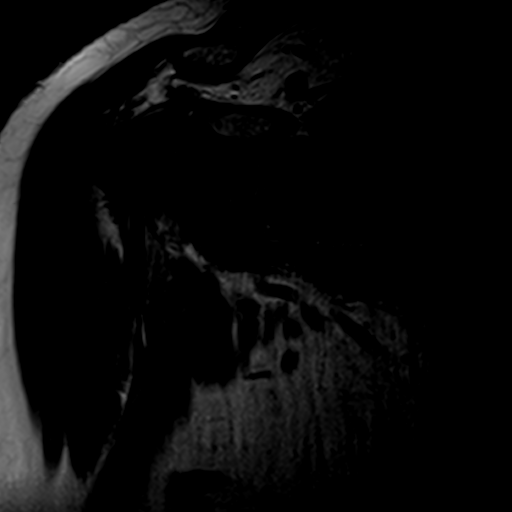
[im 11/22]
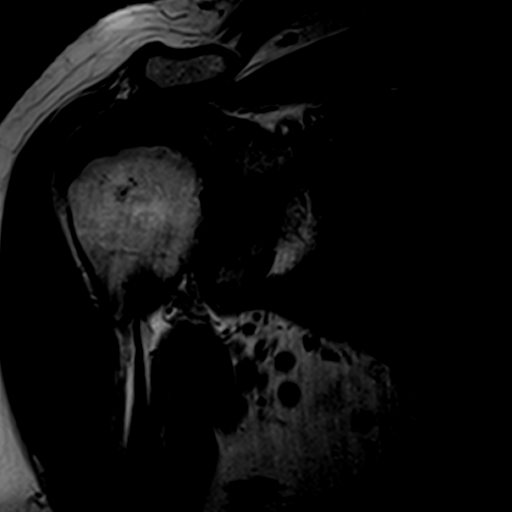
[im 15/22]
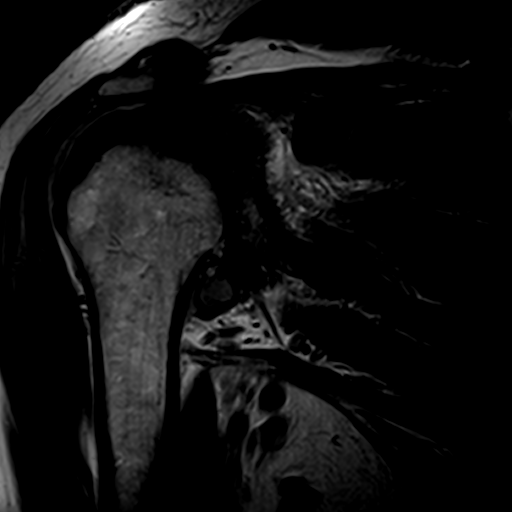
[im 18/22]
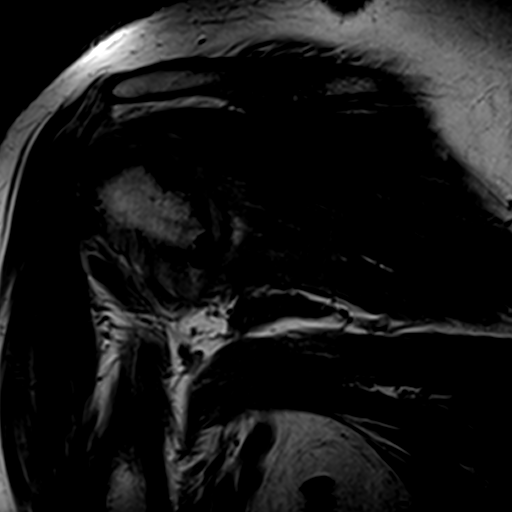
[im 22/22]
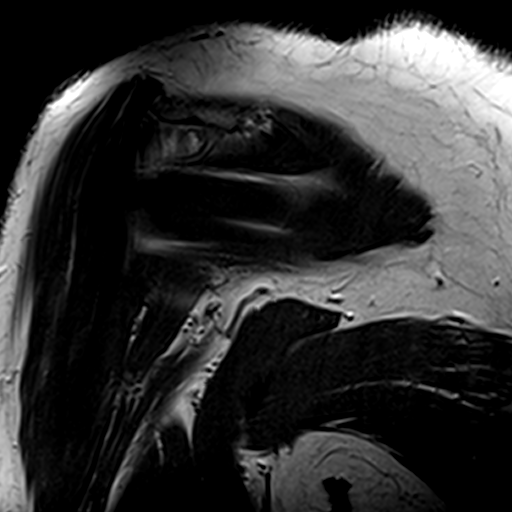

[Series 6: T1 · oblique · 3.0mm · 0.26mm/px · 6 of 26 slices shown]
[im 1/26]
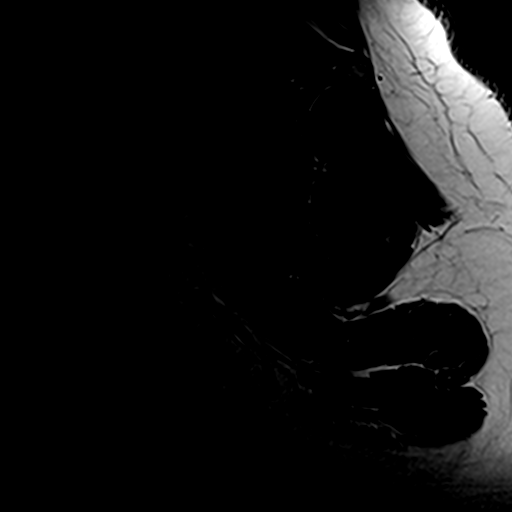
[im 4/26]
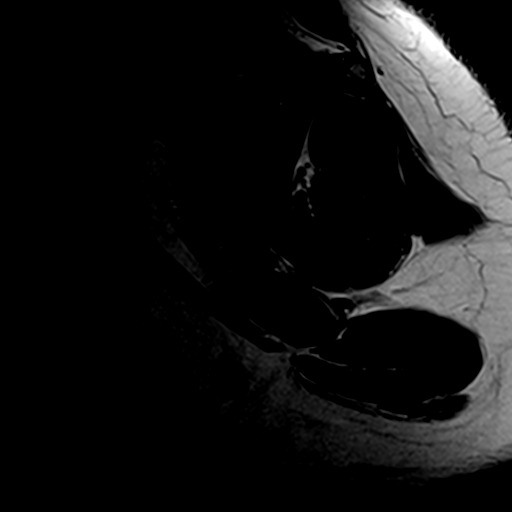
[im 7/26]
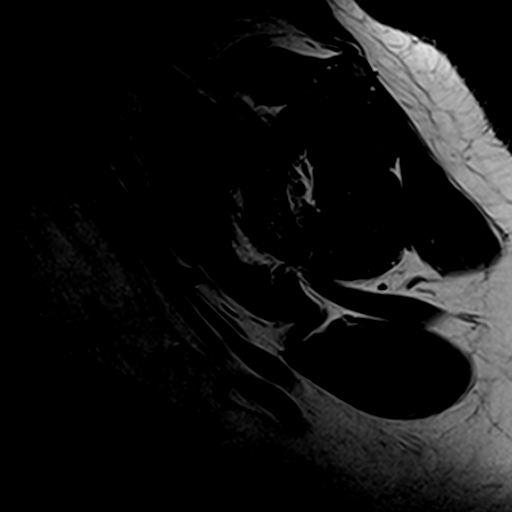
[im 10/26]
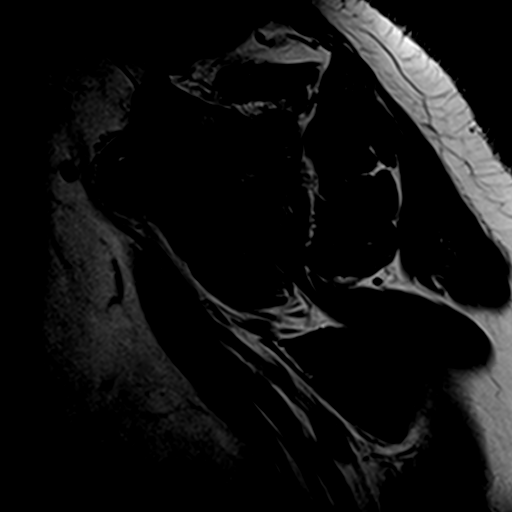
[im 13/26]
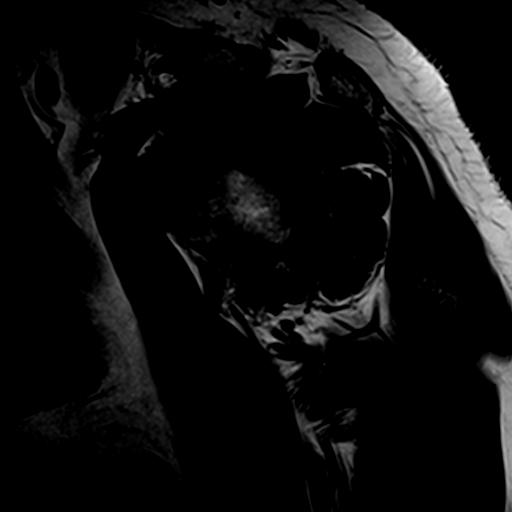
[im 22/26]
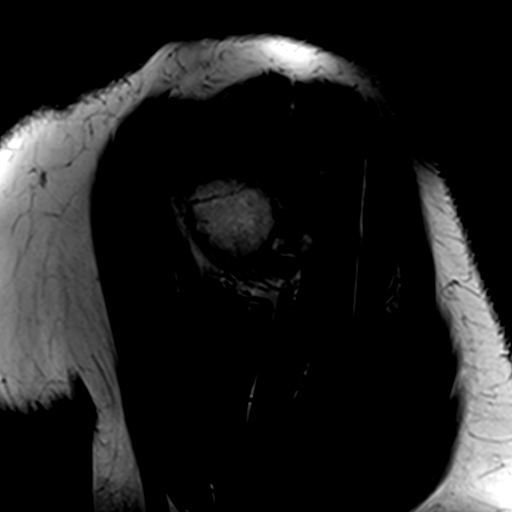

[19 of 40 positions shown; findings below may reference images not displayed]

FINDINGS: Rotator cuff:  Intact.

Muscles: Minimal atrophy of the supraspinous muscle. Extravasated
joint fluid extends along the subscapularis, supraspinous, and
infraspinatus muscles.

Biceps long head:  Properly located and intact.

Acromioclavicular Joint: Minimal degenerative changes. Type 2
acromion. No bursitis.

Glenohumeral Joint: Severe osteoarthritis of the glenohumeral joint
with complete denuding of the articular cartilage, numerous small
subcortical cysts in the humeral head and in the glenoid, and
multiple loose bodies in the joint, the largest being 12 mm in
diameter in the subcoracoid recess. Prominent joint effusion with
extravasated joint fluid into the adjacent soft tissues.

Labrum: Diffuse degeneration of the labrum. The anterior labrum is
disintegrated.

Bones: Prominent osteophyte on the inferior aspect of the humeral
head. Sclerosis of the humeral head. Severe degenerative changes of
the glenoid.

Other: No mass lesions or adenopathy.
IMPRESSION: 1. Severe osteoarthritis of the glenohumeral joint with a joint
effusion and loose bodies in the joint.
2. Intact rotator cuff.

## 2017-01-17 ENCOUNTER — Other Ambulatory Visit: Payer: Self-pay | Admitting: Licensed Clinical Social Worker

## 2017-01-17 NOTE — Patient Outreach (Signed)
Assessment:  CSW spoke via phone with client. CSW verified client identity. CSW received verbal permission from client on 01/17/17 for CSW to communicate with client about current client needs and status. Client sees [name] as primary care doctor. Client has prescribed medications and is taking medications as prescribed. [facility] RN Joellyn Quails is providing nursing support for client.  CSW and client spoke of client care plan. CSW encouraged client to communicate with CSW in next 30 days to discuss community resources of assistance for client. Client has [employer]. She has seen dentist and oral surgeon regarding dental needs of client. Client has communicated several times with [employer] insurance concierge to discuss client dental coverage with [employer] insurance. Client had reported previously that she had undergone dental procedure for extraction of tooth of client. Client previously reported that she had infected tooth extracted along with a portion of her jaw bone extracted. She said she is taking pain medication as prescribed. She said she sees [name], her primary care doctor as needed.  Client is very appreciative of support she has received from [place] and from [place].  Client has some support from her son and some support from her sister. Client said that her sons have visited her recently to help out at her home. She said her sister has visited her recently to help out as needed.  She said she plans to contact office of Dr. Onnie Graham, orthopedic surgeon, to discuss with Dr. Onnie Graham her options related to right shoulder surgery for client. She said she had to take care of her dental needs first and have infection issue cleared up prior to discussing further surgery needs of client (related to her right shoulder). She was very appreciative of support of [place], appreciative of support of RN Joellyn Quails, and appreciative of support of [place] encouraged client to call RN Joellyn Quails or [place] as needed to discuss needs of client.    Plan:  Client to communicate with CSW in next 30 days to discuss community resources of assistance for client.  CSW to collaborate as needed with RN Joellyn Quails in monitoring needs of client.  CSW to call client in 4 weeks to assess client needs at that time.  [name].Nikole Swartzentruber MSW, LCSW Licensed Clinical Social Worker [facility] Care Management [phone]

## 2017-02-02 ENCOUNTER — Telehealth: Payer: Self-pay | Admitting: *Deleted

## 2017-02-02 ENCOUNTER — Other Ambulatory Visit: Payer: Self-pay | Admitting: *Deleted

## 2017-02-02 NOTE — Patient Outreach (Signed)
[facility]) Care Management  02/02/2017  Kim Warren Dec 07, 1950 [id number]   Care coordination   [facility] CM called and followed up with [name].  She reports her medical status is much improved since her dental extractions, she is more active and she is planning with pcp and specialists for future orthopedic surgery. [name] denies any other medical needs nor need for [facility] CM to assist her further.  [name] has met all [facility] goals. [facility] CM discussed closure, closure letters and the availability for [name] to contact [facility] CM as needed for any future needs.  When [facility] CM last spoke with her she had her teeth extraction and was recovering.  [facility] CM and [name] were informed of reimbursement assistance by the insurance carrier staff [name] voices her appreciation for assistance form [facility] SW ("scott"), [facility] CM and [facility] pharmacist Arrie Aran")  [name] continues to work with insurance carrier for her reimbursement for dental services and reports she has worked with "very good staff so far from [employer] company."   Plan Sent EPIC in Copy cas closure messages to [employer], [place] and [place] Sent letters to [name] and Dr Gerarda Fraction  [facility] CM Care Plan Problem One     Most Recent Value  Care Plan Problem One  Insurance coverage of medical/dental services (anesthesia) for tooth removal  Role Documenting the Problem One  Care Management [place] for Problem One  Active  [facility] Long Term Goal   over the next 31 days patient will be able to have tooth (source of infection) removed using insurance coverage benefits  [facility] Long Term Goal Start Date  11/25/16  [facility] Long Term Goal Met Date  12/03/16  Interventions for Problem One Long Term Goal  Assess/Identify patient benefit and medical needs, discussed possible solution/general insurance processes/available coverage. Contact staff for insurance plan to review medial concerns and coverage needs to check for available benefits,  follow up with patient/progress of medical and  benefit services  [facility] CM Short Term Goal #1   over the next 14 days patient will understand available plan benefit options to meet medical needs (resolve infection source)  [facility] CM Short Term Goal #1 Start Date  11/25/16  [facility] CM Short Term Goal #1 Met Date  12/03/16  Interventions for Short Term Goal #1  Assess/Identify patient benefit and medical needs, discussed possible solution/general insurance processes/available coverage. Contact staff for insurance plan to review medial concerns and coverage needs to check for available benefits, follow up with patient/progress of medical and  benefit services      Kimberly L. Lavina Hamman, RN, BSN, [place] Care Management [phone]

## 2017-02-17 DIAGNOSIS — E039 Hypothyroidism, unspecified: Secondary | ICD-10-CM | POA: Diagnosis not present

## 2017-02-17 DIAGNOSIS — E119 Type 2 diabetes mellitus without complications: Secondary | ICD-10-CM | POA: Diagnosis not present

## 2017-02-17 DIAGNOSIS — M797 Fibromyalgia: Secondary | ICD-10-CM | POA: Diagnosis not present

## 2017-02-17 DIAGNOSIS — I10 Essential (primary) hypertension: Secondary | ICD-10-CM | POA: Diagnosis not present

## 2017-02-17 DIAGNOSIS — K219 Gastro-esophageal reflux disease without esophagitis: Secondary | ICD-10-CM | POA: Diagnosis not present

## 2017-02-17 DIAGNOSIS — Z79899 Other long term (current) drug therapy: Secondary | ICD-10-CM | POA: Diagnosis not present

## 2017-02-17 DIAGNOSIS — M545 Low back pain: Secondary | ICD-10-CM | POA: Diagnosis not present

## 2017-02-17 DIAGNOSIS — F5109 Other insomnia not due to a substance or known physiological condition: Secondary | ICD-10-CM | POA: Diagnosis not present

## 2017-02-17 DIAGNOSIS — G4459 Other complicated headache syndrome: Secondary | ICD-10-CM | POA: Diagnosis not present

## 2017-02-17 DIAGNOSIS — E785 Hyperlipidemia, unspecified: Secondary | ICD-10-CM | POA: Diagnosis not present

## 2017-02-17 DIAGNOSIS — M25519 Pain in unspecified shoulder: Secondary | ICD-10-CM | POA: Diagnosis not present

## 2017-02-17 DIAGNOSIS — M519 Unspecified thoracic, thoracolumbar and lumbosacral intervertebral disc disorder: Secondary | ICD-10-CM | POA: Diagnosis not present

## 2017-02-18 ENCOUNTER — Other Ambulatory Visit: Payer: Self-pay | Admitting: Licensed Clinical Social Worker

## 2017-02-18 NOTE — Patient Outreach (Signed)
Assessment:  CSW spoke via phone with client. CSW verified client identity. CSW  Received verbal permission from client on 02/18/17 for CSW to communicate with client about current client needs and status. Client sees [name] as primary care doctor. Client has prescribed medications and is taking medications as prescribed. [facility] RN Joellyn Quails recently discharged client from [place]. Client was appreciative of [place] services she has received from [place]. CSW and client spoke of client care plan. CSW encouraged client to communicate with CSW in next 30 days to discuss community resources of assistance for client. Client has some support from her son and has some support from her sister. Client has [employer].  Client is taking a prescribed pain medication.  Clent has good family suppport. Client has some support from her church friendds.  She said that her sister and brother in law are helpful to client as well.  She said she had an appointment with Dr. Merlene Laughter, neurolgosit, yesterday.  She said she sees Dr. Merlene Laughter, neurologist, as scheduled, to help manage pain issues for client. She said she has a Technical brewer and this pet provides relaxation for client.  Client said she has been communicating as needed with [employer] manager related to insurance reimbursement to client.  Client is well aware of her medical needs and medication needs. Client eats adequately. Client said she is doing well with her weight and her weight management.  Client sees [name] as scheduled.  Client said she has ongoing back issues.She said she has ongoing nerve pain.  She said she is learning to manage her ongoing pain issues.  Again, she see Dr. Merlene Laughter, neurologist, as scheduled. CSW reminded client of [facility] program support in nursing, social work and pharmacy areas.  CSW thanked her for phone call with CSW on 02/18/17. Client was appreciative of phone call from [place] on  02/18/17   Plan:  Client to communicate with CSW in next 30 days to discuss community resources of assistance for client.  CSW to call client in 4 weeks to assess client needs at that time.  [name].Gracy Ehly MSW, LCSW Licensed Clinical Social Worker [facility] Care Management [phone]

## 2017-03-23 ENCOUNTER — Other Ambulatory Visit: Payer: Self-pay | Admitting: Licensed Clinical Social Worker

## 2017-03-23 NOTE — Patient Outreach (Signed)
Assessment:  CSW spoke via phone with client. CSW verified client identity. CSW received verbal permission from client on 03/23/17 for CSW to communicate with client about current client needs and status of client. Client has prescribed medications and is taking medications as prescribed. She sees [name] as primary care doctor. Client said she sees Dr. Merlene Laughter, neurologist, as scheduled for pain management. She said she is trying to attend all scheduled client medical appointments. She said she has some support from her son and from her sister.  CSW and client spoke of clent care plan. CSW encouraged client to communicate with CSW in next 30 days to discuss community resources of asssitance for client. Client has [employer]. She said she is communicating with Freight forwarder at [employer] insurance to discuss insurance coverage and benefits for client.  She said she is recovering from recent dental procedures.  She said she sometimes is fatigued and has to take periodic rest breaks. She said she and her family have discussed her trying to sell her home in [place], [place] and possibly trying to move in with her daughter in [place], [place].  She said she has been able to receive offer on her home. But, she said her home needs some repairs prior to selling home. She said she has fibromyalgia and has ongoing pain issues. She said she has needed pain medication at this time that was prescribed for her by Dr. Merlene Laughter, neurologist. She said she had communicated with Dr. Merlene Laughter about possible referral of client from [place] to a neurologist in [place], [place] area. She said she was able to do activities of daily living. But, she said she could not lift objects very well, could not clean her home very well and could not take care of her yard at her home.  CSW encouraged client to call CSW at [phone] as needed to address social work needs of client. CSW thanked client for phone  call with CSW on 03/23/17. Client was very appreciative of phone call from [place] on 03/23/17.    Plan:  Client to communicate with CSW in next 30 days to discuss community resources of assistance for client.   CSW to call client in 3 weeks to assess client needs at that time.  [name].Puneet Masoner MSW, LCSW Licensed Clinical Social Worker [facility] Care Management [phone]

## 2017-03-24 ENCOUNTER — Ambulatory Visit: Payer: Self-pay | Admitting: Licensed Clinical Social Worker

## 2017-04-06 DIAGNOSIS — M25519 Pain in unspecified shoulder: Secondary | ICD-10-CM | POA: Diagnosis not present

## 2017-04-06 DIAGNOSIS — G4459 Other complicated headache syndrome: Secondary | ICD-10-CM | POA: Diagnosis not present

## 2017-04-06 DIAGNOSIS — M545 Low back pain: Secondary | ICD-10-CM | POA: Diagnosis not present

## 2017-04-06 DIAGNOSIS — M797 Fibromyalgia: Secondary | ICD-10-CM | POA: Diagnosis not present

## 2017-04-14 ENCOUNTER — Other Ambulatory Visit: Payer: Self-pay | Admitting: Licensed Clinical Social Worker

## 2017-04-14 NOTE — Patient Outreach (Signed)
Assessment:  CSW spoke via phone with client. CSW verified client identity. CSW received verbal permission from client on 04/14/17 for CSW to communicate with client about current client needs and status.  Client sees [name] as primary care doctor. Client said she had her prescribed medications and is taking medications as prescribed. She said she is trying to attend all scheduled client medical appointments.  She said she is taking a pain medication as prescribed. She had been living  alone but said she had support from her family members. Client said she sees Dr. Merlene Laughter, neurologist, as scheduled, for pain management for client. CSW and client spoke of client care plan. CSW encouraged client to communicate with CSW in the next 30 days to discuss community resources of assistance for client. Client has [employer].  She said she is sometimes fatigued and has to take periodic rest breaks. Client said she has fibromyalgia and has ongoing pain issues. She said she could do activities of daily living. But, she said she could not lift heavy objects, could not clean her home well, and could not take care of the yard at her home. She had talked with family members about possiblly selling her home and moving closer to her daughter at the [facility].  Her son, Gerald Stabs, has provided the cell phone number for [facility] as [phone]. Ashla said she may stay temporarily with another family member for a while prior to moving closer to her daughter at the [place] has talked with client about her [employer] coverage. Client is researching different insurance options for her if she should move to the [facility]. Client said she had appointment with Dr. Merlene Laughter last week to help with pain management. She said she has her prescribed pain medication. She said she is thinking of switching insurance coverage to [employer]. She has talked with a  representative about switching to [employer].  She has support from her two sons. She said she just sold her home and plans to reside for a few weeks with a relative until she goes to live at the [facility]. Client said she still fatigues easily and has to take rest breaks. CSW thanked client for phone call with CSW on 04/14/17.  CSW encouraged client to call CSW at [phone] as needed to discuss social work needs of client.  Client was appreciative of call from [place] on 04/14/17.   Plan:  Client to communicate with CSW in the next 30 days to discuss community resources of assistance for client.  CSW to call client in 3 weeks to assess client needs at that time.  [name].Darryll Raju MSW, LCSW Licensed Clinical Social Worker [facility] Care Management [phone]

## 2017-05-04 ENCOUNTER — Other Ambulatory Visit: Payer: Self-pay | Admitting: Licensed Clinical Social Worker

## 2017-05-04 ENCOUNTER — Encounter: Payer: Self-pay | Admitting: Licensed Clinical Social Worker

## 2017-05-04 NOTE — Patient Outreach (Signed)
Assessment:  CSW spoke via phone with client. CSW verified client identity. CSW received verbal permission from client on 05/04/17 for CSW to speak with client about current client needs and status. Client has been seeing Kim Warren as primary care doctor. Client said she had her prescribed medications and is taking medications as prescribed. Client does take a pain medication as prescribed. Client has [employer]. She is sometimes fatigued and has to take periodic rest breaks.  Client said that she had sold her home and had been staying temporarily with another family member for a while prior to moving closer to her daughter at the [facility].  Client said she just recently moved to [facility] and lives closer now to her daughter..Client had been researching different insurance options for her if she should move to the [facility]. Client said she had switched from [employer] insurance to [employer].  CSW informed Kim Warren that she had now met her care plan goals with [facility] CSW services. Thus, CSW informed Kim Warren that Kim Warren would discharge client on 05/04/17 from [place] since [place] had met her care plan goals.  Kim Warren agreed to this plan. She said she was appreciative of [facility] program support in nursing and social work. CSW congratulated Kim Warren on meeting her care plan goals with CSW services and with nursing services.   Plan:  CSW is discharging Kim Warren from [facility] CSW services on 05/04/17 since client has met her care plan goals with [facility] CSW services.  CSW to inform Kim Warren, Case Management Assistant, that [place] discharged client on 05/04/17 from [place] services.  CSW to fax physician case closure letter to Kim Warren informing Kim Warren that [facility] discharged client on 05/04/17 from [place] services.   Kim Warren.Kim Warren MSW, LCSW Licensed Clinical Social Worker Kim Warren Care Management [phone]

## 2017-05-26 NOTE — Patient Outreach (Signed)
CSW completed chart review correction on client.on 05/26/17.    [name].Allana Shrestha MSW, LCSW Licensed Clinical Social Worker [facility] Care Management [phone]

## 2018-11-15 ENCOUNTER — Other Ambulatory Visit: Payer: Self-pay | Admitting: *Deleted
